# Patient Record
Sex: Female | Born: 1985 | Race: Black or African American | Hispanic: No | Marital: Single | Smoking: Never smoker
Health system: Southern US, Community
[De-identification: ages and names within clinical notes are randomized; demographics above are authoritative.]

## PROBLEM LIST (undated history)

## (undated) DIAGNOSIS — Z8669 Personal history of other diseases of the nervous system and sense organs: Secondary | ICD-10-CM

## (undated) DIAGNOSIS — Z5189 Encounter for other specified aftercare: Secondary | ICD-10-CM

## (undated) DIAGNOSIS — I10 Essential (primary) hypertension: Secondary | ICD-10-CM

## (undated) DIAGNOSIS — D649 Anemia, unspecified: Secondary | ICD-10-CM

## (undated) DIAGNOSIS — N879 Dysplasia of cervix uteri, unspecified: Secondary | ICD-10-CM

## (undated) HISTORY — DX: Dysplasia of cervix uteri, unspecified: N87.9

## (undated) HISTORY — DX: Personal history of other diseases of the nervous system and sense organs: Z86.69

## (undated) HISTORY — DX: Anemia, unspecified: D64.9

## (undated) HISTORY — DX: Essential (primary) hypertension: I10

## (undated) HISTORY — PX: LEEP: SHX91

## (undated) HISTORY — DX: Encounter for other specified aftercare: Z51.89

---
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
## 2007-09-05 HISTORY — PX: CERVICAL ABLATION: SHX5771

## 2011-06-28 DIAGNOSIS — F5102 Adjustment insomnia: Secondary | ICD-10-CM | POA: Insufficient documentation

## 2011-06-28 DIAGNOSIS — G43109 Migraine with aura, not intractable, without status migrainosus: Secondary | ICD-10-CM | POA: Insufficient documentation

## 2011-06-28 DIAGNOSIS — Z309 Encounter for contraceptive management, unspecified: Secondary | ICD-10-CM | POA: Insufficient documentation

## 2011-06-28 DIAGNOSIS — J189 Pneumonia, unspecified organism: Secondary | ICD-10-CM | POA: Insufficient documentation

## 2011-06-28 DIAGNOSIS — B3731 Acute candidiasis of vulva and vagina: Secondary | ICD-10-CM | POA: Insufficient documentation

## 2011-06-28 DIAGNOSIS — R55 Syncope and collapse: Secondary | ICD-10-CM | POA: Insufficient documentation

## 2011-06-28 DIAGNOSIS — L02239 Carbuncle of trunk, unspecified: Secondary | ICD-10-CM | POA: Insufficient documentation

## 2011-06-28 DIAGNOSIS — R87623 High grade squamous intraepithelial lesion on cytologic smear of vagina (HGSIL): Secondary | ICD-10-CM | POA: Insufficient documentation

## 2011-06-28 DIAGNOSIS — M654 Radial styloid tenosynovitis [de Quervain]: Secondary | ICD-10-CM | POA: Insufficient documentation

## 2011-06-28 DIAGNOSIS — Z202 Contact with and (suspected) exposure to infections with a predominantly sexual mode of transmission: Secondary | ICD-10-CM | POA: Insufficient documentation

## 2016-09-20 ENCOUNTER — Ambulatory Visit: Payer: Self-pay | Admitting: Physician Assistant

## 2016-09-25 ENCOUNTER — Ambulatory Visit (INDEPENDENT_AMBULATORY_CARE_PROVIDER_SITE_OTHER): Payer: Self-pay | Admitting: Physician Assistant

## 2016-09-25 ENCOUNTER — Encounter: Payer: Self-pay | Admitting: Physician Assistant

## 2016-09-25 VITALS — BP 131/82 | HR 77 | Ht 63.0 in | Wt 238.0 lb

## 2016-09-25 DIAGNOSIS — Z111 Encounter for screening for respiratory tuberculosis: Secondary | ICD-10-CM

## 2016-09-25 DIAGNOSIS — R7303 Prediabetes: Secondary | ICD-10-CM | POA: Insufficient documentation

## 2016-09-25 DIAGNOSIS — Z23 Encounter for immunization: Secondary | ICD-10-CM

## 2016-09-25 DIAGNOSIS — E669 Obesity, unspecified: Secondary | ICD-10-CM

## 2016-09-25 DIAGNOSIS — IMO0001 Reserved for inherently not codable concepts without codable children: Secondary | ICD-10-CM

## 2016-09-25 DIAGNOSIS — Z6841 Body Mass Index (BMI) 40.0 and over, adult: Secondary | ICD-10-CM

## 2016-09-25 LAB — POCT GLYCOSYLATED HEMOGLOBIN (HGB A1C): Hemoglobin A1C: 6.4

## 2016-09-25 NOTE — Progress Notes (Signed)
HPI:                                                                Lindsay Hall is a 31 y.o. female who presents to [facility]: [place] today to establish care   Patient was previously going to a weight loss clinic in [place] when she had health insurance and states she was doing "shots."She discontinued this because she was "nervous" about the effects of the medication.  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 No other concerns today.   Health Maintenance Health Maintenance  Topic Date Due  . HIV Screening  09/26/2017 (Originally 08/19/1999)  . PAP SMEAR  08/22/2018  . TETANUS/TDAP  09/25/2026  . INFLUENZA VACCINE  Completed    GYN/Sexual Health  Menstrual status: having periods  Menses: regular  Last pap smear: December 2016, normal  History of abnormal pap smears: 2009, cryosurgery  Sexually active: yes   Current contraception: Nexplanon  Declined STI testing  Health Habits  Diet: poor  Exercise: no  ETOH: no  Tobacco: no  Drugs: no  Dental Exam: no  Eye Exam: n/a  Past Medical History:  Diagnosis Date  . Cervical dysplasia   . Hx of migraines    Past Surgical History:  Procedure Laterality Date  . CERVICAL ABLATION  2009   Social History  Substance Use Topics  . Smoking status: Never Smoker  . Smokeless tobacco: Never Used  . Alcohol use No   family history includes Hypertension in her brother, maternal grandmother, and mother; Migraines in her brother, mother, and sister.  Review of Systems  Constitutional: Negative for chills, fever, malaise/fatigue and weight loss.  HENT: Negative.   Eyes: Negative.   Respiratory: Negative.   Cardiovascular: Negative for chest pain, palpitations and leg swelling.  Gastrointestinal: Negative.   Genitourinary: Negative.   Musculoskeletal: Negative.   Skin: Negative.   Neurological: Negative for dizziness, loss of consciousness and  headaches.  Endo/Heme/Allergies: Negative for polydipsia.  Psychiatric/Behavioral: Negative for depression. The patient is not nervous/anxious.     Medications: No current outpatient prescriptions on file.   No current facility-administered medications for this visit.    Allergies  Allergen Reactions  . Shellfish-Derived Products Other (See Comments)    No reaction listed       Objective:  BP 131/82   Pulse 77   Ht 5\' 3"  (1.6 m)   Wt 238 lb (108 kg)   BMI 42.16 kg/m  Gen: well-groomed, obese, cooperative, not ill-appearing, no distress HEENT: normal conjunctiva, TM's clear, oropharynx clear, moist mucus membranes, no thyromegaly or tenderness Lungs: Normal work of breathing, clear to auscultation bilaterally Heart: Normal rate, regular rhythm, s1 and s2 distinct, no murmurs, clicks or rubs appreciated on this exam, no carotid bruit Abd: Soft. Nondistended, Nontender Neuro: alert and oriented x 3, EOM's intact, PERRLA, DTR's intact MSK: strength 5/5 and symmetric, normal gait Extremities: distal pulses intact, no peripheral edema Skin: warm and dry, no rashes or lesions on exposed skin Psych: normal affect, pleasant mood, normal speech and thought content   Results for orders placed or performed in visit on 09/25/16 (from the past 72 hour(s))  POCT HgB A1C     Status: Abnormal  Collection Time: 09/25/16 10:23 AM  Result Value Ref Range   Hemoglobin A1C 6.4    No results found.    Assessment and Plan: 31 y.o. female with    Class 3 obesity without serious comorbidity with body mass index (BMI) of 40.0 to 44.9 in adult, unspecified obesity type ([place]) - patient counseled on physical activity and heart healthy lifestyle - encouraged to return for medical management when she has insurance coverage for some of the weight loss medications that she would be a good candidate for  Prediabetes - POCT HgB A1C 6.4 - patient counseled on diagnosis and prediabetes eating  plan - patient is uninsured and cannot afford nutritionist    Orders Placed This Encounter  Procedures  . Flu Vaccine QUAD 36+ mos IM  . Tdap vaccine greater than or equal to 7yo IM  . TB Skin Test    Placed in the left forearm    Order Specific Question:   Has patient ever tested positive?    Answer:   No  . POCT HgB A1C     Patient education and anticipatory guidance given Patient agrees with treatment plan Follow-up in 1 year for CPE or sooner as needed  Darlyne Russian PA-C

## 2016-09-25 NOTE — Patient Instructions (Addendum)
Prediabetes Eating Plan Prediabetes-also called impaired glucose tolerance or impaired fasting glucose-is a condition that causes blood sugar (blood glucose) levels to be higher than normal. Following a healthy diet can help to keep prediabetes under control. It can also help to lower the risk of type 2 diabetes and heart disease, which are increased in people who have prediabetes. Along with regular exercise, a healthy diet:  Promotes weight loss.  Helps to control blood sugar levels.  Helps to improve the way that the body uses insulin. What do I need to know about this eating plan?  Use the glycemic index (GI) to plan your meals. The index tells you how quickly a food will raise your blood sugar. Choose low-GI foods. These foods take a longer time to raise blood sugar.  Pay close attention to the amount of carbohydrates in the food that you eat. Carbohydrates increase blood sugar levels.  Keep track of how many calories you take in. Eating the right amount of calories will help you to achieve a healthy weight. Losing about 7 percent of your starting weight can help to prevent type 2 diabetes.  You may want to follow a Mediterranean diet. This diet includes a lot of vegetables, lean meats or fish, whole grains, fruits, and healthy oils and fats. What foods can I eat? Grains  Whole grains, such as whole-wheat or whole-grain breads, crackers, cereals, and pasta. Unsweetened oatmeal. Bulgur. Barley. Quinoa. Brown rice. Corn or whole-wheat flour tortillas or taco shells. Vegetables  Lettuce. Spinach. Peas. Beets. Cauliflower. Cabbage. Broccoli. Carrots. Tomatoes. Squash. Eggplant. Herbs. Peppers. Onions. Cucumbers. Brussels sprouts. Fruits  Berries. Bananas. Apples. Oranges. Grapes. Papaya. Mango. Pomegranate. Kiwi. Grapefruit. Cherries. Meats and Other Protein Sources  Seafood. Lean meats, such as chicken and [place] or lean cuts of pork and beef. Tofu. Eggs. Nuts. Beans. Dairy  Low-fat or  fat-free dairy products, such as yogurt, cottage cheese, and cheese. Beverages  Water. Tea. Coffee. Sugar-free or diet soda. Seltzer water. Milk. Milk alternatives, such as soy or almond milk. Condiments  Mustard. Relish. Low-fat, low-sugar ketchup. Low-fat, low-sugar barbecue sauce. Low-fat or fat-free mayonnaise. Sweets and Desserts  Sugar-free or low-fat pudding. Sugar-free or low-fat ice cream and other frozen treats. Fats and Oils  Avocado. Walnuts. Olive oil. The items listed above may not be a complete list of recommended foods or beverages. Contact your dietitian for more options.  What foods are not recommended? Grains  Refined white flour and flour products, such as bread, pasta, snack foods, and cereals. Beverages  Sweetened drinks, such as sweet iced tea and soda. Sweets and Desserts  Baked goods, such as cake, cupcakes, pastries, cookies, and cheesecake. The items listed above may not be a complete list of foods and beverages to avoid. Contact your dietitian for more information.  This information is not intended to replace advice given to you by your health care provider. Make sure you discuss any questions you have with your health care provider. Document Released: 01/05/2015 Document Revised: 01/27/2016 Document Reviewed: 09/16/2014 Elsevier Interactive Patient Education  2017 Elsevier Inc.   Prediabetes Prediabetes is the condition of having a blood sugar (blood glucose) level that is higher than it should be, but not high enough for you to be diagnosed with type 2 diabetes. Having prediabetes puts you at risk for developing type 2 diabetes (type 2 diabetes mellitus). Prediabetes may be called impaired glucose tolerance or impaired fasting glucose. Prediabetes usually does not cause symptoms. Your health care provider can diagnose this condition  with blood tests. You may be tested for prediabetes if you are overweight and if you have at least one other risk factor for  prediabetes. Risk factors for prediabetes include:  Having a family member with type 2 diabetes.  Being overweight or obese.  Being older than age 91.  Being of American-Indian, African-American, Hispanic/Latino, or Asian/Pacific Islander descent.  Having an inactive (sedentary) lifestyle.  Having a history of gestational diabetes or polycystic ovarian syndrome (PCOS).  Having low levels of good cholesterol (HDL-C) or high levels of blood fats (triglycerides).  Having high blood pressure. What is blood glucose and how is blood glucose measured?   Blood glucose refers to the amount of glucose in your bloodstream. Glucose comes from eating foods that contain sugars and starches (carbohydrates) that the body breaks down into glucose. Your blood glucose level may be measured in mg/dL (milligrams per deciliter) or mmol/L (millimoles per liter).Your blood glucose may be checked with one or more of the following blood tests:  A fasting blood glucose (FBG) test. You will not be allowed to eat (you will fast) for at least 8 hours before a blood sample is taken.  A normal range for FBG is 70-100 mg/dl (3.9-5.6 mmol/L).  An A1c (hemoglobin A1c) blood test. This test provides information about blood glucose control over the previous 2?4months.  An oral glucose tolerance test (OGTT). This test measures your blood glucose twice:  After fasting. This is your baseline level.  Two hours after you drink a beverage that contains glucose. You may be diagnosed with prediabetes:  If your FBG is 100?125 mg/dL (5.6-6.9 mmol/L).  If your A1c level is 5.7?6.4%.  If your OGGT result is 140?199 mg/dL (7.8-11 mmol/L). These blood tests may be repeated to confirm your diagnosis. What happens if blood glucose is too high? The pancreas produces a hormone (insulin) that helps move glucose from the bloodstream into cells. When cells in the body do not respond properly to insulin that the body makes  (insulin resistance), excess glucose builds up in the blood instead of going into cells. As a result, high blood glucose (hyperglycemia) can develop, which can cause many complications. This is a symptom of prediabetes. What can happen if blood glucose stays higher than normal for a long time? Having high blood glucose for a long time is dangerous. Too much glucose in your blood can damage your nerves and blood vessels. Long-term damage can lead to complications from diabetes, which may include:  Heart disease.  Stroke.  Blindness.  Kidney disease.  Depression.  Poor circulation in the feet and legs, which could lead to surgical removal (amputation) in severe cases. How can prediabetes be prevented from turning into type 2 diabetes?   To help prevent type 2 diabetes, take the following actions:  Be physically active.  Do moderate-intensity physical activity for at least 30 minutes on at least 5 days of the week, or as much as told by your health care provider. This could be brisk walking, biking, or water aerobics.  Ask your health care provider what activities are safe for you. A mix of physical activities may be best, such as walking, swimming, cycling, and strength training.  Lose weight as told by your health care provider.  Losing 5-7% of your body weight can reverse insulin resistance.  Your health care provider can determine how much weight loss is best for you and can help you lose weight safely.  Follow a healthy meal plan. This includes eating lean  proteins, complex carbohydrates, fresh fruits and vegetables, low-fat dairy products, and healthy fats.  Follow instructions from your health care provider about eating or drinking restrictions.  Make an appointment to see a diet and nutrition specialist (registered dietitian) to help you create a healthy eating plan that is right for you.  Do not smoke or use any tobacco products, such as cigarettes, chewing tobacco, and  e-cigarettes. If you need help quitting, ask your health care provider.  Take over-the-counter and prescription medicines as told by your health care provider. You may be prescribed medicines that help lower the risk of type 2 diabetes. This information is not intended to replace advice given to you by your health care provider. Make sure you discuss any questions you have with your health care provider. Document Released: 12/13/2015 Document Revised: 01/27/2016 Document Reviewed: 10/12/2015 Elsevier Interactive Patient Education  2017 Elsevier Inc.   Physical Activity Recommendations  1. Aerobic exercise  Frequency: 3-5 sessions per week  Intensity: 50-80% capacity  Duration: 20 - 60 minutes  Examples: walking, treadmill, cycling, rowing, stair climbing, and arm/leg ergometry  2. Resistance exercise  Frequency: 2-3 sessions per week  Intensity: 10-15 repetitions/set to moderate fatigue  Duration: 1-3 sets of 8-10 upper and lower body exercises  Examples: calisthenics, elastic bands, cuff/hand weights, dumbbels, free weights, wall pulleys, and weight machines  Heart-Healthy Lifestyle  Eating a diet rich in vegetables, fruits and whole grains: also includes low-fat dairy products, poultry, fish, legumes, and nuts; limit intake of sweets, sugar-sweetened beverages and red meats  Getting regular exercise  Maintaining a healthy weight  Not smoking or getting help quitting  Staying on top of your health; for some people, lifestyle changes alone may not be enough to prevent a heart attack or stroke. In these cases, taking a statin at the right dose will most likely be necessary

## 2016-09-27 ENCOUNTER — Ambulatory Visit (INDEPENDENT_AMBULATORY_CARE_PROVIDER_SITE_OTHER): Payer: Self-pay | Admitting: Physician Assistant

## 2016-09-27 VITALS — BP 138/79 | HR 66 | Wt 237.0 lb

## 2016-09-27 DIAGNOSIS — Z111 Encounter for screening for respiratory tuberculosis: Secondary | ICD-10-CM

## 2016-09-27 NOTE — Progress Notes (Signed)
   Subjective:    Patient ID: Lindsay Hall, female    DOB: [birth year], 31 y.o.   MRN: [id number]  Sarh is here for a PPD read.       Review of Systems  Constitutional: Negative.   HENT: Negative.   Eyes: Negative.   Respiratory: Negative.   Cardiovascular: Negative.   Gastrointestinal: Negative.   Genitourinary: Negative.        Objective:   Physical Exam        Assessment & Plan:  Screening for TB - PPD negative, paperwork signed.

## 2016-10-02 ENCOUNTER — Ambulatory Visit (INDEPENDENT_AMBULATORY_CARE_PROVIDER_SITE_OTHER): Payer: Self-pay | Admitting: Physician Assistant

## 2016-10-02 VITALS — BP 123/66 | HR 58 | Temp 98.3°F | Ht 63.75 in | Wt 236.1 lb

## 2016-10-02 DIAGNOSIS — Z111 Encounter for screening for respiratory tuberculosis: Secondary | ICD-10-CM

## 2016-10-02 DIAGNOSIS — Z23 Encounter for immunization: Secondary | ICD-10-CM

## 2016-10-02 NOTE — Progress Notes (Signed)
   Subjective:    Patient ID: Lindsay Hall, female    DOB: Jan 18, 1986, 31 y.o.   MRN: [id number]  HPI Pt is here for a PPD Placement and Varicella vaccine. Pt has documentation of receiving the first varicella vaccine. 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  Review of Systems  Constitutional: Negative for fever.  Respiratory: Negative for shortness of breath.   Cardiovascular: Negative for chest pain.       Objective:   Physical Exam        Assessment & Plan:  Patient tolerated both injections well without complications. PPD was placed in left arm.

## 2016-10-04 ENCOUNTER — Ambulatory Visit (INDEPENDENT_AMBULATORY_CARE_PROVIDER_SITE_OTHER): Payer: Self-pay | Admitting: Physician Assistant

## 2016-10-04 VITALS — BP 119/76 | HR 66 | Ht 64.0 in | Wt 236.9 lb

## 2016-10-04 DIAGNOSIS — Z111 Encounter for screening for respiratory tuberculosis: Secondary | ICD-10-CM

## 2016-10-04 NOTE — Progress Notes (Signed)
   Subjective:    Patient ID: Lindsay Hall, female    DOB: 05-02-1986, 31 y.o.   MRN: [id number]  HPI Pt is here for PPD read.   Review of Systems     Objective:   Physical Exam        Assessment & Plan:  Results were negative 0.mm.

## 2018-04-03 ENCOUNTER — Ambulatory Visit (INDEPENDENT_AMBULATORY_CARE_PROVIDER_SITE_OTHER): Payer: Self-pay | Admitting: Physician Assistant

## 2018-04-03 ENCOUNTER — Encounter: Payer: Self-pay | Admitting: Physician Assistant

## 2018-04-03 VITALS — BP 118/77 | HR 65 | Temp 98.6°F | Wt 239.0 lb

## 2018-04-03 DIAGNOSIS — R197 Diarrhea, unspecified: Secondary | ICD-10-CM

## 2018-04-03 DIAGNOSIS — R112 Nausea with vomiting, unspecified: Secondary | ICD-10-CM

## 2018-04-03 MED ORDER — METRONIDAZOLE 250 MG PO TABS: 250.0000 mg | ORAL_TABLET | Freq: Three times a day (TID) | ORAL | 0 refills | Status: AC

## 2018-04-03 MED ORDER — ONDANSETRON 4 MG PO TBDP: 4.0000 mg | ORAL_TABLET | Freq: Three times a day (TID) | ORAL | 0 refills | Status: DC | PRN

## 2018-04-03 NOTE — Progress Notes (Signed)
HPI:                                                                Lindsay Hall is a 32 y.o. female who presents to [facility]: [place] today for vomiting and diarrhea  Patient reports recurrent diarrhea, nausea, vomiting and "sulfa burps". Problem began in 2017. Symptoms were waxing and waning over a 6 month period. Reports she was treated for BV with Flagyl and her GI symptoms went away.  Most recent episode began approximately 6 weeks ago and again are waxing and waning. Reports vomiting has been so forceful she has fecal incontinence. Emesis contains stomach contents. Denies any international travel or suspect food intake. Denies fever, chills, malaise, abdominal pain, hematemesis, hematochezia/melena.  No flowsheet data found.  No flowsheet data found.    Past Medical History:  Diagnosis Date  . Cervical dysplasia   . Hx of migraines    Past Surgical History:  Procedure Laterality Date  . CERVICAL ABLATION  2009   Social History   Tobacco Use  . Smoking status: Never Smoker  . Smokeless tobacco: Never Used  Substance Use Topics  . Alcohol use: No   family history includes Hypertension in her brother, maternal grandmother, and mother; Migraines in her brother, mother, and sister.    ROS: negative except as noted in the HPI  Medications: No current outpatient medications on file.   No current facility-administered medications for this visit.    Allergies  Allergen Reactions  . Shellfish-Derived Products Other (See Comments)    No reaction listed       Objective:  BP 118/77   Pulse 65   Temp 98.6 F (37 C) (Oral)   Wt 239 lb (108.4 kg)   BMI 41.02 kg/m  Gen:  alert, not ill-appearing, no distress, appropriate for age, obese female HEENT: head normocephalic without obvious abnormality, conjunctiva and cornea clear, trachea midline Pulm: Normal work of breathing, normal phonation, clear to auscultation  bilaterally, no wheezes, rales or rhonchi CV: Normal rate, regular rhythm, s1 and s2 distinct, no murmurs, clicks or rubs  GI: bowel sounds active, abdomen soft, non-tender, non-distended, no palpable masses Neuro: alert and oriented x 3, no tremor MSK: extremities atraumatic, normal gait and station Skin: intact, no rashes on exposed skin, no jaundice, no cyanosis   No results found for this or any previous visit (from the past 72 hour(s)). No results found.    Assessment and Plan: 32 y.o. female with   Diarrhea of presumed infectious origin - Plan: Ova and parasite examination, Giardia Antigen (Stool), Stool Culture, metroNIDAZOLE (FLAGYL) 250 MG tablet  Non-intractable vomiting with nausea, unspecified vomiting type - Plan: ondansetron (ZOFRAN-ODT) 4 MG disintegrating tablet  Afebrile, no tachycardia, benign abdominal exam Symptoms are concerning for Giardia or intestinal parasite. Stool studies pending. Will cover her for Giardia with Flagyl. Instructed to start Flagyl after submitting stool samples to try to prevent a false negative. Supportive care with anti-emetic and bland diet.  Patient education and anticipatory guidance given Patient agrees with treatment plan Follow-up as needed if symptoms worsen or fail to improve  Darlyne Russian PA-C

## 2018-04-03 NOTE — Patient Instructions (Signed)
Food Choices to Help Relieve Diarrhea, Adult  When you have diarrhea, the foods you eat and your eating habits are very important. Choosing the right foods and drinks can help:  · Relieve diarrhea.  · Replace lost fluids and nutrients.  · Prevent dehydration.    What general guidelines should I follow?  Relieving diarrhea  · Choose foods with less than 2 g or .07 oz. of fiber per serving.  · Limit fats to less than 8 tsp (38 g or 1.34 oz.) a day.  · Avoid the following:  ? Foods and beverages sweetened with high-fructose corn syrup, honey, or sugar alcohols such as xylitol, sorbitol, and mannitol.  ? Foods that contain a lot of fat or sugar.  ? Fried, greasy, or spicy foods.  ? High-fiber grains, breads, and cereals.  ? Raw fruits and vegetables.  · Eat foods that are rich in probiotics. These foods include dairy products such as yogurt and fermented milk products. They help increase healthy bacteria in the stomach and intestines (gastrointestinal tract, or GI tract).  · If you have lactose intolerance, avoid dairy products. These may make your diarrhea worse.  · Take medicine to help stop diarrhea (antidiarrheal medicine) only as told by your health care provider.  Replacing nutrients  · Eat small meals or snacks every 3–4 hours.  · Eat bland foods, such as white rice, toast, or baked potato, until your diarrhea starts to get better. Gradually reintroduce nutrient-rich foods as tolerated or as told by your health care provider. This includes:  ? Well-cooked protein foods.  ? Peeled, seeded, and soft-cooked fruits and vegetables.  ? Low-fat dairy products.  · Take vitamin and mineral supplements as told by your health care provider.  Preventing dehydration    · Start by sipping water or a special solution to prevent dehydration (oral rehydration solution, ORS). Urine that is clear or pale yellow means that you are getting enough fluid.  · Try to drink at least 8–10 cups of fluid each day to help replace lost  fluids.  · You may add other liquids in addition to water, such as clear juice or decaffeinated sports drinks, as tolerated or as told by your health care provider.  · Avoid drinks with caffeine, such as coffee, tea, or soft drinks.  · Avoid alcohol.  What foods are recommended?  The items listed may not be a complete list. Talk with your health care provider about what dietary choices are best for you.  Grains  White rice. White, French, or pita breads (fresh or toasted), including plain rolls, buns, or bagels. White pasta. Saltine, soda, or graham crackers. Pretzels. Low-fiber cereal. Cooked cereals made with water (such as cornmeal, farina, or cream cereals). Plain muffins. Matzo. Melba toast. Zwieback.  Vegetables  Potatoes (without the skin). Most well-cooked and canned vegetables without skins or seeds. Tender lettuce.  Fruits  Apple sauce. Fruits canned in juice. Cooked apricots, cherries, grapefruit, peaches, pears, or plums. Fresh bananas and cantaloupe.  Meats and other protein foods  Baked or boiled chicken. Eggs. Tofu. Fish. Seafood. Smooth nut butters. Ground or well-cooked tender beef, ham, veal, lamb, pork, or poultry.  Dairy  Plain yogurt, kefir, and unsweetened liquid yogurt. Lactose-free milk, buttermilk, skim milk, or soy milk. Low-fat or nonfat hard cheese.  Beverages  Water. Low-calorie sports drinks. Fruit juices without pulp. Strained tomato and vegetable juices. Decaffeinated teas. Sugar-free beverages not sweetened with sugar alcohols. Oral rehydration solutions, if approved by your health care   provider.  Seasoning and other foods  Bouillon, broth, or soups made from recommended foods.  What foods are not recommended?  The items listed may not be a complete list. Talk with your health care provider about what dietary choices are best for you.  Grains  Whole grain, whole wheat, bran, or rye breads, rolls, pastas, and crackers. Wild or brown rice. Whole grain or bran cereals. Barley. Oats  and oatmeal. Corn tortillas or taco shells. Granola. Popcorn.  Vegetables  Raw vegetables. Fried vegetables. Cabbage, broccoli, Brussels sprouts, artichokes, baked beans, beet greens, corn, kale, legumes, peas, sweet potatoes, and yams. Potato skins. Cooked spinach and cabbage.  Fruits  Dried fruit, including raisins and dates. Raw fruits. Stewed or dried prunes. Canned fruits with syrup.  Meat and other protein foods  Fried or fatty meats. Deli meats. Chunky nut butters. Nuts and seeds. Beans and lentils. Bacon. Hot dogs. Sausage.  Dairy  High-fat cheeses. Whole milk, chocolate milk, and beverages made with milk, such as milk shakes. Half-and-half. Cream. sour cream. Ice cream.  Beverages  Caffeinated beverages (such as coffee, tea, soda, or energy drinks). Alcoholic beverages. Fruit juices with pulp. Prune juice. Soft drinks sweetened with high-fructose corn syrup or sugar alcohols. High-calorie sports drinks.  Fats and oils  Butter. Cream sauces. Margarine. Salad oils. Plain salad dressings. Olives. Avocados. Mayonnaise.  Sweets and desserts  Sweet rolls, doughnuts, and sweet breads. Sugar-free desserts sweetened with sugar alcohols such as xylitol and sorbitol.  Seasoning and other foods  Honey. Hot sauce. Chili powder. Gravy. Cream-based or milk-based soups. Pancakes and waffles.  Summary  · When you have diarrhea, the foods you eat and your eating habits are very important.  · Make sure you get at least 8–10 cups of fluid each day, or enough to keep your urine clear or pale yellow.  · Eat bland foods and gradually reintroduce healthy, nutrient-rich foods as tolerated, or as told by your health care provider.  · Avoid high-fiber, fried, greasy, or spicy foods.  This information is not intended to replace advice given to you by your health care provider. Make sure you discuss any questions you have with your health care provider.  Document Released: 11/11/2003 Document Revised: 08/18/2016 Document  Reviewed: 08/18/2016  Elsevier Interactive Patient Education © 2018 [employer]

## 2018-04-11 LAB — STOOL CULTURE
MICRO NUMBER: 90917026
MICRO NUMBER:: 90917025
MICRO NUMBER:: 90917029
SHIGA RESULT:: NOT DETECTED
SPECIMEN QUALITY:: ADEQUATE
SPECIMEN QUALITY:: ADEQUATE
SPECIMEN QUALITY:: ADEQUATE

## 2018-04-11 LAB — GIARDIA ANTIGEN
MICRO NUMBER: 90917027
RESULT:: NOT DETECTED
SPECIMEN QUALITY:: ADEQUATE

## 2018-04-11 LAB — OVA AND PARASITE EXAMINATION
CONCENTRATE RESULT:: NONE SEEN
MICRO NUMBER:: 90917028
SPECIMEN QUALITY:: ADEQUATE
TRICHROME RESULT: NONE SEEN

## 2018-04-16 ENCOUNTER — Encounter: Payer: Self-pay | Admitting: Physician Assistant

## 2018-08-12 ENCOUNTER — Ambulatory Visit: Payer: Self-pay | Admitting: Osteopathic Medicine

## 2018-08-13 ENCOUNTER — Ambulatory Visit (INDEPENDENT_AMBULATORY_CARE_PROVIDER_SITE_OTHER): Payer: Self-pay | Admitting: Osteopathic Medicine

## 2018-08-13 ENCOUNTER — Encounter: Payer: Self-pay | Admitting: Osteopathic Medicine

## 2018-08-13 VITALS — BP 145/90 | HR 59 | Temp 98.0°F | Wt 241.3 lb

## 2018-08-13 DIAGNOSIS — Z3046 Encounter for surveillance of implantable subdermal contraceptive: Secondary | ICD-10-CM

## 2018-08-13 DIAGNOSIS — Z01818 Encounter for other preprocedural examination: Secondary | ICD-10-CM

## 2018-08-13 NOTE — Progress Notes (Signed)
Nexplanon Removal Procedure Note PRE-OP DIAGNOSIS: Nexplanon, desire for change of contraception - wants to pursue BTL POST-OP DIAGNOSIS: Same  PROCEDURE: Nexplanon Removal  Performing Physician: Sheppard Coil PROCEDURE:  Anesthesia: 2% Lidocaine w/ epinephrine 5 ml  Procedure: Consent obtained. A time-out was performed prior to initiating procedure to be sure of right patient and right location. The area surrounding the Nexplanon was prepared in the usual sterile manner. The site was anesthetized with lidocaine. A skin incision was made over the distal aspect of the device. The capsule lysed sharply and the device removed using a hemostat. Hemostasis was assured. The site was dressed with SteriStrips. The patient tolerated the procedure well.  Followup: The patient tolerated the procedure well without complications. Standard post-procedure care is explained and return precautions are given.     The primary encounter diagnosis was Encounter for Nexplanon removal. A diagnosis of Tubal ligation evaluation was also pertinent to this visit.   Orders Placed This Encounter  Procedures  . Ambulatory referral to Obstetrics / Gynecology   Pt currently abstinent.      Return for recheck as needed, otherwise routine check-up as directed w/ Evlyn Clines .

## 2018-08-13 NOTE — Patient Instructions (Addendum)
If redness, pain, or discharge from the wound, see me right away! Steri strips will stay in place on their own.

## 2018-09-16 ENCOUNTER — Ambulatory Visit (INDEPENDENT_AMBULATORY_CARE_PROVIDER_SITE_OTHER): Payer: Self-pay | Admitting: Physician Assistant

## 2018-09-16 ENCOUNTER — Encounter: Payer: Self-pay | Admitting: Physician Assistant

## 2018-09-16 VITALS — BP 138/83 | HR 62 | Temp 98.3°F | Wt 238.0 lb

## 2018-09-16 DIAGNOSIS — H5712 Ocular pain, left eye: Secondary | ICD-10-CM

## 2018-09-16 MED ORDER — POLYMYXIN B-TRIMETHOPRIM 10000-0.1 UNIT/ML-% OP SOLN: 1.0000 [drp] | Freq: Four times a day (QID) | OPHTHALMIC | 0 refills | Status: AC

## 2018-09-16 MED ORDER — ERYTHROMYCIN 5 MG/GM OP OINT: 1.0000 "application " | TOPICAL_OINTMENT | Freq: Every day | OPHTHALMIC | 0 refills | Status: AC

## 2018-09-16 MED ORDER — TETRACAINE HCL 0.5 % OP SOLN
2.0000 [drp] | Freq: Once | OPHTHALMIC | Status: AC
  Administered 2018-09-16: 2 [drp] via OPHTHALMIC

## 2018-09-16 NOTE — Progress Notes (Signed)
HPI:                                                                Lindsay Hall is a 33 y.o. female who presents to [facility] today for eye redness  Developed throbbing left lateral eye pain, sudden onset 4 days ago. Patient reports she woke up with the pain. States there was initially a foreign body sensation, but this has since resolved. Pain is constant, described as throbbing, rated as mild, gradually improving. 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 Denies URI symptoms. She does not wear contact lenses.    Past Medical History:  Diagnosis Date  . Cervical dysplasia   . Hx of migraines    Past Surgical History:  Procedure Laterality Date  . CERVICAL ABLATION  2009   Social History   Tobacco Use  . Smoking status: Never Smoker  . Smokeless tobacco: Never Used  Substance Use Topics  . Alcohol use: No   family history includes Hypertension in her brother, maternal grandmother, and mother; Migraines in her brother, mother, and sister.    ROS: negative except as noted in the HPI  Medications: Current Outpatient Medications  Medication Sig Dispense Refill  . etonogestrel (NEXPLANON) 68 MG IMPL implant 1 each by Subdermal route once.    Marland Kitchen erythromycin ophthalmic ointment Place 1 application into the left eye at bedtime for 5 days. 5 g 0  . trimethoprim-polymyxin b (POLYTRIM) ophthalmic solution Place 1 drop into the left eye 4 (four) times daily for 5 days. 10 mL 0   No current facility-administered medications for this visit.    Allergies  Allergen Reactions  . Shellfish-Derived Products Other (See Comments)    No reaction listed       Objective:  BP 138/83   Pulse 62   Temp 98.3 F (36.8 C) (Oral)   Wt 238 lb (108 kg)   BMI 40.85 kg/m  Physical Exam  Eyes: Pupils are equal, round, and reactive to light. EOM are normal. Lids are everted and swept, no  foreign bodies found. Right eye exhibits no chemosis, no discharge and no exudate. No foreign body present in the right eye. Left eye exhibits no chemosis, no discharge and no exudate. No foreign body present in the left eye. Left conjunctiva is injected. Left conjunctiva has no hemorrhage. Left eye exhibits normal extraocular motion (pain with left lateral upward gaze ).  Slit lamp exam:      The left eye shows no corneal abrasion, no foreign body and no fluorescein uptake.    Visual acuity OD 20/20, OS 20/25, OU 20/25 +photophobia     No results found for this or any previous visit (from the past 67 hour(s)). No results found.    Assessment and Plan: 33 y.o. female with   .Cherysh was seen today for conjunctivitis.  Diagnoses and all orders for this visit:  Acute left eye pain -     trimethoprim-polymyxin b (POLYTRIM) ophthalmic solution; Place 1 drop into the left eye 4 (four) times daily for 5 days. -     erythromycin ophthalmic ointment; Place 1 application into the left eye at bedtime for 5 days. -  Ambulatory referral to Ophthalmology    Reassuring features - symptoms are gradually improving, pain is rated as mild, pain improves with topical anaesthetic, visual acuity intact Woods lamp exam did not show evidence of corneal abrasion Differential also includes uveitis  Will start topical antibiotics for possible tiny corneal abrasion and refer to Ophth  Patient education and anticipatory guidance given Patient agrees with treatment plan Follow-up as needed if symptoms worsen or fail to improve  Darlyne Russian PA-C

## 2018-09-16 NOTE — Patient Instructions (Signed)
Corneal Abrasion    A corneal abrasion is a scratch or injury to the clear covering over the front of your eye (cornea). Your cornea forms a clear dome that protects your eye and helps to focus your vision. Your cornea is made up of many layers. The surface layer is a single layer of cells (corneal epithelium). It is one of the most sensitive tissues in your body. A corneal abrasion can be very painful.  If a corneal abrasion is not treated, it can become infected and cause an ulcer. This can lead to scarring. A scarred cornea can affect your vision. Sometimes abrasions come back in the same area, even after the original injury has healed (recurrent erosion syndrome).  What are the causes?  This condition may be caused by:  · A poke in the eye.  · A gritty or irritating substance (foreign body) in the eye.  · Excessive eye rubbing.  · Very dry eyes.  · Certain eye infections.  · Contact lenses that fit poorly or are worn for a long period of time. You can also injure your cornea when putting contacts lenses in your eye or taking them out.  · Eye surgery.  Sometimes, the cause is unknown.  What are the signs or symptoms?  Symptoms of this condition include:  · Eye pain. The pain may get worse when your eye is open or when you move your eye.  · A feeling of something stuck in your eye.  · Having trouble keeping your eye open, or not being able to keep it open.  · Tearing and redness.  · Sensitivity to light.  · Blurred vision.  · Headache.  How is this diagnosed?  This condition may be diagnosed based on:  · Your medical history.  · Your symptoms.  · An eye exam. You may work with a health care provider who specializes in diseases and conditions of the eye (ophthalmologist). Before the eye exam, numbing drops may be put into your eye. You may also have dye put in your eye with a dropper or a small paper strip. The dye makes the abrasion easy to see when your ophthalmologist examines your eye with a light. Your  ophthalmologist may look at your eye through an eye scope (slit lamp).  How is this treated?  Treatment may vary depending on the cause of your condition, and it may include:  · Washing out your eye.  · Removing any foreign body.  · Antibiotic drops or ointment to treat an infection.  · Steroid drops or ointment to treat redness, irritation, or inflammation.  · Pain medicine.  · An eye patch to keep your eye closed.  Follow these instructions at home:  Medicines  · Use eye drops or ointments as told by your eye care provider.  · If you were prescribed antibiotic drops or ointment, use them as told by your eye care provider. Do not stop using the antibiotic even if you start to feel better.  · Take over-the-counter and prescription medicines only as told by your eye care provider.  · Do not drive or use heavy machinery while taking prescription pain medicine.  General instructions  · If you have an eye patch, wear it as told by your eye care provider.  ? Do not drive or use machinery while wearing an eye patch. Your ability to judge distances will be impaired.  ? Follow instructions from your eye care provider about when to remove the patch.  ·   Ask your eye care provider whether you can use a cold, wet cloth (compress) on your eye to relieve pain.  · Do not rub or touch your eye. Do not wash out your eye.  · Do not wear contact lenses until your eye care provider says that this is okay.  · Avoid bright light and eye strain.  · Keep all follow-up visits as told by your eye care provider. This is important for preventing infection and vision loss.  Contact a health care provider if:  · You continue to have eye pain and other symptoms for more than 2 days.  · You develop new symptoms, such as redness, tearing, or discharge.  · You have discharge that makes your eyelids stick together in the morning.  · Your eye patch becomes so loose that you can blink your eye.  · Symptoms return after the original abrasion has  healed.  Get help right away if:  · You have severe eye pain that does not get better with medicine.  · You have vision loss.  Summary  · A corneal abrasion is a scratch on the outer layer of the clear covering over the front of your eye (cornea).  · Corneal abrasion can cause eye pain, redness, tearing, and blurred vision.  · This condition is usually treated with medicine to prevent infection and scarring. You also may have to wear an eye patch to cover your eye.  · Let your eye care provider know if your symptoms continue for more than 2 days.  This information is not intended to replace advice given to you by your health care provider. Make sure you discuss any questions you have with your health care provider.  Document Released: 08/18/2000 Document Revised: 08/01/2016 Document Reviewed: 08/01/2016  Elsevier Interactive Patient Education © 2019 [employer]

## 2018-09-20 ENCOUNTER — Encounter: Payer: Self-pay | Admitting: Physician Assistant

## 2020-05-26 ENCOUNTER — Encounter: Payer: Self-pay | Admitting: Medical-Surgical

## 2020-05-26 ENCOUNTER — Ambulatory Visit (INDEPENDENT_AMBULATORY_CARE_PROVIDER_SITE_OTHER): Payer: BC Managed Care – PPO | Admitting: Medical-Surgical

## 2020-05-26 ENCOUNTER — Other Ambulatory Visit: Payer: Self-pay

## 2020-05-26 VITALS — BP 131/85 | HR 76 | Temp 98.1°F | Ht 64.0 in | Wt 240.1 lb

## 2020-05-26 DIAGNOSIS — R002 Palpitations: Secondary | ICD-10-CM | POA: Diagnosis not present

## 2020-05-26 DIAGNOSIS — Z114 Encounter for screening for human immunodeficiency virus [HIV]: Secondary | ICD-10-CM | POA: Diagnosis not present

## 2020-05-26 DIAGNOSIS — L659 Nonscarring hair loss, unspecified: Secondary | ICD-10-CM

## 2020-05-26 DIAGNOSIS — Z1159 Encounter for screening for other viral diseases: Secondary | ICD-10-CM

## 2020-05-26 DIAGNOSIS — R7303 Prediabetes: Secondary | ICD-10-CM

## 2020-05-26 DIAGNOSIS — Z0000 Encounter for general adult medical examination without abnormal findings: Secondary | ICD-10-CM

## 2020-05-26 DIAGNOSIS — Z308 Encounter for other contraceptive management: Secondary | ICD-10-CM

## 2020-05-26 DIAGNOSIS — R0683 Snoring: Secondary | ICD-10-CM

## 2020-05-26 DIAGNOSIS — L739 Follicular disorder, unspecified: Secondary | ICD-10-CM

## 2020-05-26 NOTE — Progress Notes (Signed)
HPI: Lindsay Hall is a 34 y.o. female who  has a past medical history of Cervical dysplasia and migraines.  she presents to [facility] today, 05/26/20,  for chief complaint of:  Annual physical exam  Dentist: upcoming appointment for 2 teeth pulled Eye exam: none recently, wears glasses, not updated in years Exercise: none currently due to having 2 jobs Diet: tries to eat healthy as much as possible, cut out cows milk and substituted almond milk  Concerns: Daily headaches- wakes with the almost every morning; history of migraines  Snores loudly, daytime sleepiness and fatigue, witnessed apneic episodes  Palpitations- daily, several times a day, lasts for about 5 minutes, heart pounding, no chest pain  Bilateral lower extremity edema- lasts for hours, improves with elevation, wears compression stockings  Shortness of breath-intermittent, daily, with or without palpitation, not related to rest or activity, no dizziness/lightheadedness or nausea, + diaphoresis with her episode over the weekend.  Resolve spontaneously.  Past medical, surgical, social and family history reviewed:  Patient Active Problem List   Diagnosis Date Noted  . Class 3 obesity without serious comorbidity with body mass index (BMI) of 40.0 to 44.9 in adult 09/25/2016  . Prediabetes 09/25/2016    Past Surgical History:  Procedure Laterality Date  . CERVICAL ABLATION  2009    Social History   Tobacco Use  . Smoking status: Never Smoker  . Smokeless tobacco: Never Used  Substance Use Topics  . Alcohol use: No    Family History  Problem Relation Age of Onset  . Hypertension Mother   . Migraines Mother   . Migraines Sister   . Hypertension Brother   . Migraines Brother   . Hypertension Maternal Grandmother      Current medication list and allergy/intolerance information reviewed:    Current Outpatient Medications  Medication Sig Dispense Refill  .  aspirin-acetaminophen-caffeine (EXCEDRIN MIGRAINE) 250-250-65 MG tablet Take by mouth every 6 (six) hours as needed for headache.     No current facility-administered medications for this visit.    Allergies  Allergen Reactions  . Shellfish-Derived Products Other (See Comments)    No reaction listed      Review of Systems:  Constitutional:  No  fever, no chills, No recent illness, No unintentional weight changes. + significant fatigue.   HEENT: +  headache, no vision change, no hearing change, No sore throat, No  sinus pressure  Cardiac: No  chest pain, No  pressure, + palpitations, No  Orthopnea, + bilateral lower extremity edema  Respiratory:  + shortness of breath. No  Cough  Gastrointestinal: No  abdominal pain, No  nausea, No  vomiting,  No  blood in stool, No  diarrhea, No  constipation   Musculoskeletal: No new myalgia/arthralgia  Skin: No  Rash, No other wounds/concerning lesions, small pedunculated dark brown skin tag to the posterior right neck  Genitourinary: No  incontinence, No  abnormal genital bleeding, No abnormal genital discharge  Hem/Onc: No  easy bruising/bleeding, No  abnormal lymph node  Endocrine: No cold intolerance,  No heat intolerance. No polyuria/polydipsia/polyphagia   Neurologic: No  weakness, No  dizziness, No  slurred speech/focal weakness/facial droop  Psychiatric: No  concerns with depression, No  concerns with anxiety, No sleep problems, No mood problems, + increased stress  Exam:  BP 131/85   Pulse 76   Temp 98.1 F (36.7 C) (Oral)   Ht 5' 4"  (1.626 m)   Wt 240 lb 1.6 oz (108.9  kg)   LMP 05/17/2020 (Exact Date)   SpO2 99%   BMI 41.21 kg/m   Constitutional: VS see above. General Appearance: alert, well-developed, well-nourished, NAD  Eyes: Normal lids and conjunctive, non-icteric sclera  Ears, Nose, Mouth, Throat: MMM, Normal external inspection ears/nares/mouth/lips/gums. TM normal bilaterally. Pharynx/tonsils no erythema, no  exudate. Nasal mucosa normal.   Neck: No masses, trachea midline. No thyroid enlargement. No tenderness/mass appreciated. No lymphadenopathy  Respiratory: Normal respiratory effort. no wheeze, no rhonchi, no rales  Cardiovascular: S1/S2 normal, no murmur, no rub/gallop auscultated. RRR. + lower extremity edema. Pedal pulse II/IV bilaterally DP and PT. No carotid bruit or JVD. No abdominal aortic bruit.  Gastrointestinal: Nontender, no masses. No hepatomegaly, no splenomegaly. No hernia appreciated. Bowel sounds normal. Rectal exam deferred.   Musculoskeletal: Gait normal. No clubbing/cyanosis of digits.   Neurological: Normal balance/coordination. No tremor. No cranial nerve deficit on limited exam. Motor and sensation intact and symmetric. Cerebellar reflexes intact.   Skin: warm, dry, intact. No rash/ulcer. No concerning nevi or subq nodules on limited exam.    Psychiatric: Normal judgment/insight. Normal mood and affect. Oriented x3.    No results found for this or any previous visit (from the past 72 hour(s)).  No results found.   ASSESSMENT/PLAN:   1. Annual physical exam Checking CBC, CMP, and lipid panel today. - CBC - COMPLETE METABOLIC PANEL WITH GFR - Lipid panel  2. Prediabetes Checking hemoglobin A1c today. - HgB A1c  3. Encounter for screening for HIV Discussed screening recommendations.  Patient agreeable so adding to blood work today. - HIV Antibody (routine testing w rflx)  4. Need for hepatitis C screening test Discussed screening recommendations.  Patient agreeable so adding this to blood work today as well. - Hepatitis C Antibody  5. Palpitations In office EKG completed-normal sinus rhythm with sinus arrhythmia, rate of 61 with normal axis.  Checking TSH.  She has had no previous cardiac work-up so we will go ahead and get an echocardiogram.  Also plan to do a Zio patch for long-term monitoring to see if we can catch evidence of her palpitations. - EKG  12-Lead - ECHOCARDIOGRAM COMPLETE; Future - LONG TERM MONITOR (3-14 DAYS); Future - TSH  6. Loud snoring Stop bang risk score of sleep apnea was 6 showing high risk.  She has never had a sleep study so we will go ahead and order a home sleep study.  This could definitely be the cause of her daytime sleepiness, fatigue, and morning headaches. - Home sleep test  7. Encounter for other contraceptive management Previously had a Nexplanon for birth control but has had this removed.  Not currently using any birth control measures and does not desire future pregnancies.  Would like to discuss contraception options with an OB/GYN, specifically surgical options.   - Ambulatory referral to Obstetrics / Gynecology  Orders Placed This Encounter  Procedures  . CBC  . COMPLETE METABOLIC PANEL WITH GFR  . Lipid panel  . HgB A1c  . HIV Antibody (routine testing w rflx)  . Hepatitis C Antibody  . TSH  . Ambulatory referral to Obstetrics / Gynecology  . LONG TERM MONITOR (3-14 DAYS)  . EKG 12-Lead  . ECHOCARDIOGRAM COMPLETE  . Home sleep test    No orders of the defined types were placed in this encounter.   Patient Instructions  Preventive Care 12-86 Years Old, Female Preventive care refers to visits with your health care provider and lifestyle choices that can promote health  and wellness. This includes:  A yearly physical exam. This may also be called an annual well check.  Regular dental visits and eye exams.  Immunizations.  Screening for certain conditions.  Healthy lifestyle choices, such as eating a healthy diet, getting regular exercise, not using drugs or products that contain nicotine and tobacco, and limiting alcohol use. What can I expect for my preventive care visit? Physical exam Your health care provider will check your:  Height and weight. This may be used to calculate body mass index (BMI), which tells if you are at a healthy weight.  Heart rate and blood  pressure.  Skin for abnormal spots. Counseling Your health care provider may ask you questions about your:  Alcohol, tobacco, and drug use.  Emotional well-being.  Home and relationship well-being.  Sexual activity.  Eating habits.  Work and work Statistician.  Method of birth control.  Menstrual cycle.  Pregnancy history. What immunizations do I need?  Influenza (flu) vaccine  This is recommended every year. Tetanus, diphtheria, and pertussis (Tdap) vaccine  You may need a Td booster every 10 years. Varicella (chickenpox) vaccine  You may need this if you have not been vaccinated. Human papillomavirus (HPV) vaccine  If recommended by your health care provider, you may need three doses over 6 months. Measles, mumps, and rubella (MMR) vaccine  You may need at least one dose of MMR. You may also need a second dose. Meningococcal conjugate (MenACWY) vaccine  One dose is recommended if you are age 50-21 years and a first-year college student living in a residence hall, or if you have one of several medical conditions. You may also need additional booster doses. Pneumococcal conjugate (PCV13) vaccine  You may need this if you have certain conditions and were not previously vaccinated. Pneumococcal polysaccharide (PPSV23) vaccine  You may need one or two doses if you smoke cigarettes or if you have certain conditions. Hepatitis A vaccine  You may need this if you have certain conditions or if you travel or work in places where you may be exposed to hepatitis A. Hepatitis B vaccine  You may need this if you have certain conditions or if you travel or work in places where you may be exposed to hepatitis B. Haemophilus influenzae type b (Hib) vaccine  You may need this if you have certain conditions. You may receive vaccines as individual doses or as more than one vaccine together in one shot (combination vaccines). Talk with your health care provider about the risks  and benefits of combination vaccines. What tests do I need?  Blood tests  Lipid and cholesterol levels. These may be checked every 5 years starting at age 36.  Hepatitis C test.  Hepatitis B test. Screening  Diabetes screening. This is done by checking your blood sugar (glucose) after you have not eaten for a while (fasting).  Sexually transmitted disease (STD) testing.  BRCA-related cancer screening. This may be done if you have a family history of breast, ovarian, tubal, or peritoneal cancers.  Pelvic exam and Pap test. This may be done every 3 years starting at age 56. Starting at age 40, this may be done every 5 years if you have a Pap test in combination with an HPV test. Talk with your health care provider about your test results, treatment options, and if necessary, the need for more tests. Follow these instructions at home: Eating and drinking   Eat a diet that includes fresh fruits and vegetables, whole grains, lean  protein, and low-fat dairy.  Take vitamin and mineral supplements as recommended by your health care provider.  Do not drink alcohol if: ? Your health care provider tells you not to drink. ? You are pregnant, may be pregnant, or are planning to become pregnant.  If you drink alcohol: ? Limit how much you have to 0-1 drink a day. ? Be aware of how much alcohol is in your drink. In the [place] one drink equals one 12 oz bottle of beer (355 mL), one 5 oz glass of wine (148 mL), or one 1 oz glass of hard liquor (44 mL). Lifestyle  Take daily care of your teeth and gums.  Stay active. Exercise for at least 30 minutes on 5 or more days each week.  Do not use any products that contain nicotine or tobacco, such as cigarettes, e-cigarettes, and chewing tobacco. If you need help quitting, ask your health care provider.  If you are sexually active, practice safe sex. Use a condom or other form of birth control (contraception) in order to prevent pregnancy and STIs  (sexually transmitted infections). If you plan to become pregnant, see your health care provider for a preconception visit. What's next?  Visit your health care provider once a year for a well check visit.  Ask your health care provider how often you should have your eyes and teeth checked.  Stay up to date on all vaccines. This information is not intended to replace advice given to you by your health care provider. Make sure you discuss any questions you have with your health care provider. Document Revised: 05/02/2018 Document Reviewed: 05/02/2018 Elsevier Patient Education  [place].  Follow-up plan: Return in about 6 weeks (around 07/07/2020) for mood follow up.  Clearnce Sorrel, DNP, APRN, FNP-BC [facility]

## 2020-05-26 NOTE — Patient Instructions (Signed)
Preventive Care 20-36 Years Old, Female Preventive care refers to visits with your health care provider and lifestyle choices that can promote health and wellness. This includes:  A yearly physical exam. This may also be called an annual well check.  Regular dental visits and eye exams.  Immunizations.  Screening for certain conditions.  Healthy lifestyle choices, such as eating a healthy diet, getting regular exercise, not using drugs or products that contain nicotine and tobacco, and limiting alcohol use. What can I expect for my preventive care visit? Physical exam Your health care provider will check your:  Height and weight. This may be used to calculate body mass index (BMI), which tells if you are at a healthy weight.  Heart rate and blood pressure.  Skin for abnormal spots. Counseling Your health care provider may ask you questions about your:  Alcohol, tobacco, and drug use.  Emotional well-being.  Home and relationship well-being.  Sexual activity.  Eating habits.  Work and work Statistician.  Method of birth control.  Menstrual cycle.  Pregnancy history. What immunizations do I need?  Influenza (flu) vaccine  This is recommended every year. Tetanus, diphtheria, and pertussis (Tdap) vaccine  You may need a Td booster every 10 years. Varicella (chickenpox) vaccine  You may need this if you have not been vaccinated. Human papillomavirus (HPV) vaccine  If recommended by your health care provider, you may need three doses over 6 months. Measles, mumps, and rubella (MMR) vaccine  You may need at least one dose of MMR. You may also need a second dose. Meningococcal conjugate (MenACWY) vaccine  One dose is recommended if you are age 75-21 years and a first-year college student living in a residence hall, or if you have one of several medical conditions. You may also need additional booster doses. Pneumococcal conjugate (PCV13) vaccine  You may need  this if you have certain conditions and were not previously vaccinated. Pneumococcal polysaccharide (PPSV23) vaccine  You may need one or two doses if you smoke cigarettes or if you have certain conditions. Hepatitis A vaccine  You may need this if you have certain conditions or if you travel or work in places where you may be exposed to hepatitis A. Hepatitis B vaccine  You may need this if you have certain conditions or if you travel or work in places where you may be exposed to hepatitis B. Haemophilus influenzae type b (Hib) vaccine  You may need this if you have certain conditions. You may receive vaccines as individual doses or as more than one vaccine together in one shot (combination vaccines). Talk with your health care provider about the risks and benefits of combination vaccines. What tests do I need?  Blood tests  Lipid and cholesterol levels. These may be checked every 5 years starting at age 33.  Hepatitis C test.  Hepatitis B test. Screening  Diabetes screening. This is done by checking your blood sugar (glucose) after you have not eaten for a while (fasting).  Sexually transmitted disease (STD) testing.  BRCA-related cancer screening. This may be done if you have a family history of breast, ovarian, tubal, or peritoneal cancers.  Pelvic exam and Pap test. This may be done every 3 years starting at age 76. Starting at age 102, this may be done every 5 years if you have a Pap test in combination with an HPV test. Talk with your health care provider about your test results, treatment options, and if necessary, the need for more tests.  Follow these instructions at home: Eating and drinking   Eat a diet that includes fresh fruits and vegetables, whole grains, lean protein, and low-fat dairy.  Take vitamin and mineral supplements as recommended by your health care provider.  Do not drink alcohol if: ? Your health care provider tells you not to drink. ? You are  pregnant, may be pregnant, or are planning to become pregnant.  If you drink alcohol: ? Limit how much you have to 0-1 drink a day. ? Be aware of how much alcohol is in your drink. In the [place] one drink equals one 12 oz bottle of beer (355 mL), one 5 oz glass of wine (148 mL), or one 1 oz glass of hard liquor (44 mL). Lifestyle  Take daily care of your teeth and gums.  Stay active. Exercise for at least 30 minutes on 5 or more days each week.  Do not use any products that contain nicotine or tobacco, such as cigarettes, e-cigarettes, and chewing tobacco. If you need help quitting, ask your health care provider.  If you are sexually active, practice safe sex. Use a condom or other form of birth control (contraception) in order to prevent pregnancy and STIs (sexually transmitted infections). If you plan to become pregnant, see your health care provider for a preconception visit. What's next?  Visit your health care provider once a year for a well check visit.  Ask your health care provider how often you should have your eyes and teeth checked.  Stay up to date on all vaccines. This information is not intended to replace advice given to you by your health care provider. Make sure you discuss any questions you have with your health care provider. Document Revised: 05/02/2018 Document Reviewed: 05/02/2018 Elsevier Patient Education  2020 [employer].

## 2020-05-27 LAB — COMPLETE METABOLIC PANEL WITH GFR
AG Ratio: 1 (calc) (ref 1.0–2.5)
ALT: 7 U/L (ref 6–29)
AST: 9 U/L — ABNORMAL LOW (ref 10–30)
Albumin: 3.1 g/dL — ABNORMAL LOW (ref 3.6–5.1)
Alkaline phosphatase (APISO): 76 U/L (ref 31–125)
BUN/Creatinine Ratio: 10 (calc) (ref 6–22)
BUN: 13 mg/dL (ref 7–25)
CO2: 28 mmol/L (ref 20–32)
Calcium: 8.7 mg/dL (ref 8.6–10.2)
Chloride: 104 mmol/L (ref 98–110)
Creat: 1.25 mg/dL — ABNORMAL HIGH (ref 0.50–1.10)
GFR, Est African American: 65 mL/min/{1.73_m2} (ref >=60)
GFR, Est Non African American: 56 mL/min/{1.73_m2} — ABNORMAL LOW (ref >=60)
Globulin: 3.2 g/dL (calc) (ref 1.9–3.7)
Glucose, Bld: 88 mg/dL (ref 65–99)
Potassium: 4 mmol/L (ref 3.5–5.3)
Sodium: 138 mmol/L (ref 135–146)
Total Bilirubin: 0.3 mg/dL (ref 0.2–1.2)
Total Protein: 6.3 g/dL (ref 6.1–8.1)

## 2020-05-27 LAB — CBC
HCT: 34.9 % — ABNORMAL LOW (ref 35.0–45.0)
Hemoglobin: 11.6 g/dL — ABNORMAL LOW (ref 11.7–15.5)
MCH: 28.3 pg (ref 27.0–33.0)
MCHC: 33.2 g/dL (ref 32.0–36.0)
MCV: 85.1 fL (ref 80.0–100.0)
MPV: 11.9 fL (ref 7.5–12.5)
Platelets: 291 10*3/uL (ref 140–400)
RBC: 4.1 10*6/uL (ref 3.80–5.10)
RDW: 13.5 % (ref 11.0–15.0)
WBC: 7.2 10*3/uL (ref 3.8–10.8)

## 2020-05-27 LAB — HIV ANTIBODY (ROUTINE TESTING W REFLEX): HIV 1&2 Ab, 4th Generation: NONREACTIVE

## 2020-05-27 LAB — LIPID PANEL
Cholesterol: 225 mg/dL — ABNORMAL HIGH (ref <200)
HDL: 45 mg/dL — ABNORMAL LOW (ref >=50)
LDL Cholesterol (Calc): 157 mg/dL (calc) — ABNORMAL HIGH
Non-HDL Cholesterol (Calc): 180 mg/dL (calc) — ABNORMAL HIGH (ref <130)
Total CHOL/HDL Ratio: 5 (calc) — ABNORMAL HIGH (ref <5.0)
Triglycerides: 109 mg/dL (ref <150)

## 2020-05-27 LAB — HEMOGLOBIN A1C
Hgb A1c MFr Bld: 5.8 % of total Hgb — ABNORMAL HIGH (ref <5.7)
Mean Plasma Glucose: 120 (calc)
eAG (mmol/L): 6.6 (calc)

## 2020-05-27 LAB — HEPATITIS C ANTIBODY
Hepatitis C Ab: NONREACTIVE
SIGNAL TO CUT-OFF: 0.02 (ref <1.00)

## 2020-05-27 LAB — TSH: TSH: 1.97 mIU/L

## 2020-05-31 ENCOUNTER — Ambulatory Visit (INDEPENDENT_AMBULATORY_CARE_PROVIDER_SITE_OTHER): Payer: BC Managed Care – PPO

## 2020-05-31 DIAGNOSIS — R002 Palpitations: Secondary | ICD-10-CM

## 2020-06-16 ENCOUNTER — Other Ambulatory Visit: Payer: Self-pay

## 2020-06-16 ENCOUNTER — Ambulatory Visit (HOSPITAL_BASED_OUTPATIENT_CLINIC_OR_DEPARTMENT_OTHER)
Admission: RE | Admit: 2020-06-16 | Discharge: 2020-06-16 | Disposition: A | Discharge status: Home / Self Care | Payer: BC Managed Care – PPO | Source: Ambulatory Visit | Attending: Medical-Surgical | Admitting: Medical-Surgical

## 2020-06-16 DIAGNOSIS — R002 Palpitations: Secondary | ICD-10-CM | POA: Diagnosis present

## 2020-06-16 LAB — ECHOCARDIOGRAM COMPLETE
Area-P 1/2: 3.27 cm2
S' Lateral: 3.4 cm

## 2020-06-24 ENCOUNTER — Encounter: Payer: Self-pay | Admitting: Obstetrics and Gynecology

## 2020-06-24 ENCOUNTER — Ambulatory Visit (INDEPENDENT_AMBULATORY_CARE_PROVIDER_SITE_OTHER): Payer: BC Managed Care – PPO | Admitting: Obstetrics and Gynecology

## 2020-06-24 ENCOUNTER — Other Ambulatory Visit: Payer: Self-pay

## 2020-06-24 ENCOUNTER — Other Ambulatory Visit (HOSPITAL_COMMUNITY)
Admission: RE | Admit: 2020-06-24 | Discharge: 2020-06-24 | Disposition: A | Discharge status: Home / Self Care | Payer: BC Managed Care – PPO | Source: Ambulatory Visit | Attending: Obstetrics and Gynecology | Admitting: Obstetrics and Gynecology

## 2020-06-24 VITALS — BP 137/86 | HR 105 | Resp 16 | Ht 64.0 in | Wt 238.0 lb

## 2020-06-24 DIAGNOSIS — Z124 Encounter for screening for malignant neoplasm of cervix: Secondary | ICD-10-CM

## 2020-06-24 DIAGNOSIS — Z3009 Encounter for other general counseling and advice on contraception: Secondary | ICD-10-CM

## 2020-06-24 NOTE — Progress Notes (Signed)
° °  GYNECOLOGY OFFICE NOTE  History:  34 y.o. [id number] here today for discussion of contraception. She does not want to be pregnant, wanted to have tubal ligation after birth of her last child but was told she would have too much scar tissue.  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   Review of Systems:  Pertinent items noted in HPI and remainder of comprehensive ROS otherwise negative.   Objective:  Physical Exam BP 137/86    Pulse (!) 105    Resp 16    Ht 5\' 4"  (1.626 m)    Wt 238 lb (108 kg)    LMP 06/20/2020    BMI 40.85 kg/m  CONSTITUTIONAL: Well-developed, well-nourished female in no acute distress.  HENT:  Normocephalic, atraumatic. External right and left ear normal. Oropharynx is clear and moist EYES: Conjunctivae and EOM are normal. Pupils are equal, round, and reactive to light. No scleral icterus.  NECK: Normal range of motion, supple, no masses SKIN: Skin is warm and dry. No rash noted. Not diaphoretic. No erythema. No pallor. NEUROLOGIC: Alert and oriented to person, place, and time. Normal reflexes, muscle tone coordination. No cranial nerve deficit noted. PSYCHIATRIC: Normal mood and affect. Normal  behavior. Normal judgment and thought content. CARDIOVASCULAR: Normal heart rate noted RESPIRATORY: Effort normal, no problems with respiration noted ABDOMEN: Soft, no distention noted.   PELVIC: Normal appearing external genitalia; normal appearing vaginal mucosa and cervix.  No abnormal discharge noted.  Pap smear obtained.   MUSCULOSKELETAL: Normal range of motion. No edema noted.  Exam done with chaperone present.  Labs and Imaging   Assessment & Plan:   1. Cervical cancer screening H/o abnormal pap with LEEP/cryo Pap today  2. Encounter for counseling regarding contraception - Reviewed tubal ligation, risks of laparoscopic surgery including infection, hemorrhage, damage to intestines - with unknown amount of scarring and incision in upper abdomen, would be reasonable to take for laparoscopic tubal ligation - reviewed risks/benefits of IUD as well - she will try IUD and then if dislikes IUD, will plan for lap BTL   Routine preventative health maintenance measures emphasized. Please refer to After Visit Summary for other counseling recommendations.   Return in about 4 weeks (around 07/22/2020) for IUD insertion.  Total face-to-face time with patient: 25 minutes. Over 50% of encounter was spent on counseling and coordination of care.  Feliz Beam, M.D. Attending Center for [employer])

## 2020-06-25 LAB — CYTOLOGY - PAP
Comment: NEGATIVE
Diagnosis: NEGATIVE
High risk HPV: NEGATIVE

## 2020-06-28 NOTE — Progress Notes (Addendum)
Virtual Visit via Video Note  Subjective:    I connected with Lindsay Hall on 06/30/20 by a video enabled telemedicine application and verified that I am speaking with the correct person using two identifiers.   I discussed the limitations of evaluation and management by telemedicine and the availability of in person appointments. The patient expressed understanding and agreed to proceed.  Patient location: home Provider location: medical office  CC: fever, left chest gas  HPI: Pleasant 34 year old female presenting via MyChart video visit for  follow up from hospital visit for LUQ pain. Was diagnosed at that time with a UTI but a small left pleural effusion was also found on CT. She was treated in the ED with Rocephin and sent home with a prescription for Keflex x 7 days. Labs in the ED showed significant proteinuria, worsened kidney function (GFR 50, creatinine 1.55), normocytic anemia, and leukocytosis at 12.3.  Today, she presents via MyChart video visit with reports of chest pressure, left chest pain, chest congestion, mild cough productive of white phlegm, fever t-max 101.7, chills, decreased appetite, PND, increased belching, bloating, and brain fog. She has been taking the Keflex as prescribed but no other medications or supplements. She was not COVID tested at the hospital and reports she was unaware that her CT showed a pleural effusion.   I reviewed the past medical history, family history, social history, surgical history, and allergies today and no changes were needed.  Please see the problem list section below in epic for further details.  Past Medical History: Past Medical History:  Diagnosis Date  . Cervical dysplasia   . Hx of migraines    Past Surgical History: Past Surgical History:  Procedure Laterality Date  . CERVICAL ABLATION  2009  . LEEP    . 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 Marital status: Significant Other    Spouse name: Not on file  . Number of children: Not on file  . Years of education: Not on file  . Highest education level: Not on file  Occupational History  . Not on file  Tobacco Use  . Smoking status: Never Smoker  . Smokeless tobacco: Never Used  Vaping Use  . Vaping Use: Never used  Substance and Sexual Activity  . Alcohol use: No  . Drug use: No  . Sexual activity: Yes    Birth control/protection: None  Other Topics Concern  . Not on file  Social History Narrative  . Not on file   Social Determinants of Health   Financial Resource Strain:   . Difficulty of Paying Living Expenses: Not on file  Food Insecurity:   . Worried About Charity fundraiser in the Last Year: Not on file  . Ran Out of Food in the Last Year: Not on file  Transportation Needs:   . Lack of Transportation (Medical): Not on file  . Lack of Transportation (Non-Medical): Not on file  Physical Activity:   . Days of Exercise per Week: Not on file  . Minutes of Exercise per Session: Not on file  Stress:   . Feeling of Stress : Not on file  Social Connections:   . Frequency of Communication with Friends and Family: Not on file  . Frequency of Social Gatherings with Friends and Family: Not on file  .  Attends Religious Services: Not on file  . Active Member of Clubs or Organizations: Not on file  . Attends Archivist Meetings: Not on file  . Marital Status: Not on file   Family History: Family History  Problem Relation Age of Onset  . Hypertension Mother   . Migraines Mother   . Migraines Sister   . Hypertension Brother   . Migraines Brother   . Hypertension Maternal Grandmother    Allergies: Allergies  Allergen Reactions  . Shellfish-Derived Products Other (See Comments)    No reaction listed   Medications: See med rec.  Review of  Systems: See HPI for pertinent positives and negatives.   Objective:    General: Well Developed, well nourished, and in no acute distress.  Neuro: Alert and oriented x3, extra-ocular muscles intact, sensation grossly intact.  HEENT: Normocephalic, atraumatic, pupils equal round reactive to light, neck supple, no masses, no lymphadenopathy, thyroid nonpalpable.  Skin: Warm and dry, no rashes. Cardiac: Regular rate and rhythm, no murmurs rubs or gallops, no lower extremity edema.  Respiratory: Clear to auscultation bilaterally. Not using accessory muscles, speaking in full sentences.   Impression and Recommendations:    1. Pleural effusion/LUQ pain/Respiratory symptoms Rechecking CBC with diff. Stat CXR. Okay to use Tylenol and Ibuprofen for pain/fever. - CBC with Differential/Platelet - DG Chest 2 View; Future  2. Fever, unspecified fever cause Checking CBC with diff. Stat CXR. Rechecking UA and culture. Drive up COVID swab. Okay to use Tylenol and Ibuprofen for pain/fever.  - CBC with Differential/Platelet - DG Chest 2 View; Future - Urinalysis, Routine w reflex microscopic - Urine Culture - Novel Coronavirus, NAA ([facility])  3. Urinary tract infection with hematuria, site unspecified Rechecking urinalysis. Sending for culture for sensitivities. - Urinalysis, Routine w reflex microscopic - Urine Culture  4. Renal insufficiency Rechecking CMP.  - COMPLETE METABOLIC PANEL WITH GFR  Return if symptoms worsen or fail to improve.   20 minutes of non face-to-face time was provided during this encounter. ___________________________________________ Clearnce Sorrel, DNP, APRN, FNP-BC [facility] [place]

## 2020-06-29 ENCOUNTER — Encounter: Payer: Self-pay | Admitting: Medical-Surgical

## 2020-06-29 ENCOUNTER — Telehealth (INDEPENDENT_AMBULATORY_CARE_PROVIDER_SITE_OTHER): Payer: BC Managed Care – PPO | Admitting: Medical-Surgical

## 2020-06-29 ENCOUNTER — Ambulatory Visit (INDEPENDENT_AMBULATORY_CARE_PROVIDER_SITE_OTHER): Payer: BC Managed Care – PPO

## 2020-06-29 ENCOUNTER — Other Ambulatory Visit: Payer: Self-pay

## 2020-06-29 VITALS — BP 142/76 | Temp 99.6°F

## 2020-06-29 DIAGNOSIS — R1012 Left upper quadrant pain: Secondary | ICD-10-CM

## 2020-06-29 DIAGNOSIS — R0989 Other specified symptoms and signs involving the circulatory and respiratory systems: Secondary | ICD-10-CM

## 2020-06-29 DIAGNOSIS — N390 Urinary tract infection, site not specified: Secondary | ICD-10-CM

## 2020-06-29 DIAGNOSIS — N289 Disorder of kidney and ureter, unspecified: Secondary | ICD-10-CM

## 2020-06-29 DIAGNOSIS — J189 Pneumonia, unspecified organism: Secondary | ICD-10-CM | POA: Diagnosis not present

## 2020-06-29 DIAGNOSIS — R000 Tachycardia, unspecified: Secondary | ICD-10-CM | POA: Diagnosis not present

## 2020-06-29 DIAGNOSIS — R509 Fever, unspecified: Secondary | ICD-10-CM | POA: Diagnosis not present

## 2020-06-29 DIAGNOSIS — J181 Lobar pneumonia, unspecified organism: Secondary | ICD-10-CM

## 2020-06-29 DIAGNOSIS — N179 Acute kidney failure, unspecified: Secondary | ICD-10-CM | POA: Diagnosis not present

## 2020-06-29 DIAGNOSIS — A419 Sepsis, unspecified organism: Secondary | ICD-10-CM | POA: Diagnosis not present

## 2020-06-29 DIAGNOSIS — J90 Pleural effusion, not elsewhere classified: Secondary | ICD-10-CM

## 2020-06-29 DIAGNOSIS — R319 Hematuria, unspecified: Secondary | ICD-10-CM

## 2020-06-29 DIAGNOSIS — Z20822 Contact with and (suspected) exposure to covid-19: Secondary | ICD-10-CM | POA: Diagnosis not present

## 2020-06-29 DIAGNOSIS — R6520 Severe sepsis without septic shock: Secondary | ICD-10-CM | POA: Diagnosis not present

## 2020-06-29 DIAGNOSIS — R778 Other specified abnormalities of plasma proteins: Secondary | ICD-10-CM | POA: Diagnosis not present

## 2020-06-29 DIAGNOSIS — M7989 Other specified soft tissue disorders: Secondary | ICD-10-CM | POA: Diagnosis not present

## 2020-06-29 MED ORDER — HYDROCODONE-HOMATROPINE 5-1.5 MG/5ML PO SYRP: 5.0000 mL | ORAL_SOLUTION | Freq: Three times a day (TID) | ORAL | 0 refills | Status: DC | PRN

## 2020-06-30 ENCOUNTER — Other Ambulatory Visit: Payer: Self-pay | Admitting: Medical-Surgical

## 2020-06-30 DIAGNOSIS — J90 Pleural effusion, not elsewhere classified: Secondary | ICD-10-CM | POA: Insufficient documentation

## 2020-06-30 DIAGNOSIS — R0602 Shortness of breath: Secondary | ICD-10-CM | POA: Diagnosis not present

## 2020-06-30 DIAGNOSIS — J189 Pneumonia, unspecified organism: Secondary | ICD-10-CM | POA: Diagnosis not present

## 2020-06-30 DIAGNOSIS — R6520 Severe sepsis without septic shock: Secondary | ICD-10-CM | POA: Insufficient documentation

## 2020-06-30 DIAGNOSIS — N179 Acute kidney failure, unspecified: Secondary | ICD-10-CM | POA: Insufficient documentation

## 2020-06-30 DIAGNOSIS — I150 Renovascular hypertension: Secondary | ICD-10-CM | POA: Insufficient documentation

## 2020-06-30 DIAGNOSIS — R000 Tachycardia, unspecified: Secondary | ICD-10-CM | POA: Diagnosis not present

## 2020-06-30 DIAGNOSIS — I10 Essential (primary) hypertension: Secondary | ICD-10-CM | POA: Insufficient documentation

## 2020-06-30 DIAGNOSIS — N83201 Unspecified ovarian cyst, right side: Secondary | ICD-10-CM | POA: Diagnosis not present

## 2020-06-30 DIAGNOSIS — A419 Sepsis, unspecified organism: Secondary | ICD-10-CM | POA: Insufficient documentation

## 2020-06-30 DIAGNOSIS — R918 Other nonspecific abnormal finding of lung field: Secondary | ICD-10-CM | POA: Diagnosis not present

## 2020-06-30 HISTORY — DX: Acute kidney failure, unspecified: N17.9

## 2020-06-30 HISTORY — DX: Sepsis, unspecified organism: A41.9

## 2020-06-30 LAB — SARS-COV-2, NAA 2 DAY TAT

## 2020-06-30 LAB — NOVEL CORONAVIRUS, NAA: SARS-CoV-2, NAA: NOT DETECTED

## 2020-07-01 DIAGNOSIS — R6520 Severe sepsis without septic shock: Secondary | ICD-10-CM | POA: Diagnosis not present

## 2020-07-01 DIAGNOSIS — I150 Renovascular hypertension: Secondary | ICD-10-CM | POA: Diagnosis not present

## 2020-07-01 DIAGNOSIS — J189 Pneumonia, unspecified organism: Secondary | ICD-10-CM | POA: Diagnosis not present

## 2020-07-01 DIAGNOSIS — N179 Acute kidney failure, unspecified: Secondary | ICD-10-CM | POA: Diagnosis not present

## 2020-07-01 DIAGNOSIS — A419 Sepsis, unspecified organism: Secondary | ICD-10-CM | POA: Diagnosis not present

## 2020-07-02 DIAGNOSIS — A419 Sepsis, unspecified organism: Secondary | ICD-10-CM | POA: Diagnosis not present

## 2020-07-02 DIAGNOSIS — J189 Pneumonia, unspecified organism: Secondary | ICD-10-CM | POA: Diagnosis not present

## 2020-07-02 DIAGNOSIS — N179 Acute kidney failure, unspecified: Secondary | ICD-10-CM | POA: Diagnosis not present

## 2020-07-02 DIAGNOSIS — K5900 Constipation, unspecified: Secondary | ICD-10-CM | POA: Insufficient documentation

## 2020-07-02 DIAGNOSIS — I150 Renovascular hypertension: Secondary | ICD-10-CM | POA: Diagnosis not present

## 2020-07-02 DIAGNOSIS — R6520 Severe sepsis without septic shock: Secondary | ICD-10-CM | POA: Diagnosis not present

## 2020-07-03 DIAGNOSIS — J189 Pneumonia, unspecified organism: Secondary | ICD-10-CM | POA: Diagnosis not present

## 2020-07-03 DIAGNOSIS — N179 Acute kidney failure, unspecified: Secondary | ICD-10-CM | POA: Diagnosis not present

## 2020-07-03 DIAGNOSIS — A419 Sepsis, unspecified organism: Secondary | ICD-10-CM | POA: Diagnosis not present

## 2020-07-03 DIAGNOSIS — R6520 Severe sepsis without septic shock: Secondary | ICD-10-CM | POA: Diagnosis not present

## 2020-07-03 DIAGNOSIS — I150 Renovascular hypertension: Secondary | ICD-10-CM | POA: Diagnosis not present

## 2020-07-03 LAB — CBC WITH DIFFERENTIAL/PLATELET

## 2020-07-03 LAB — URINE CULTURE

## 2020-07-03 LAB — URINALYSIS, ROUTINE W REFLEX MICROSCOPIC

## 2020-07-03 LAB — COMPLETE METABOLIC PANEL WITH GFR

## 2020-07-04 DIAGNOSIS — I150 Renovascular hypertension: Secondary | ICD-10-CM | POA: Diagnosis not present

## 2020-07-04 DIAGNOSIS — J189 Pneumonia, unspecified organism: Secondary | ICD-10-CM | POA: Diagnosis not present

## 2020-07-04 DIAGNOSIS — A419 Sepsis, unspecified organism: Secondary | ICD-10-CM | POA: Diagnosis not present

## 2020-07-04 DIAGNOSIS — N179 Acute kidney failure, unspecified: Secondary | ICD-10-CM | POA: Diagnosis not present

## 2020-07-04 DIAGNOSIS — R6520 Severe sepsis without septic shock: Secondary | ICD-10-CM | POA: Diagnosis not present

## 2020-07-07 ENCOUNTER — Ambulatory Visit: Payer: BC Managed Care – PPO | Admitting: Medical-Surgical

## 2020-07-12 ENCOUNTER — Ambulatory Visit (INDEPENDENT_AMBULATORY_CARE_PROVIDER_SITE_OTHER): Payer: Self-pay | Admitting: Medical-Surgical

## 2020-07-12 ENCOUNTER — Other Ambulatory Visit: Payer: Self-pay

## 2020-07-12 ENCOUNTER — Encounter: Payer: Self-pay | Admitting: Medical-Surgical

## 2020-07-12 ENCOUNTER — Ambulatory Visit (INDEPENDENT_AMBULATORY_CARE_PROVIDER_SITE_OTHER): Payer: Self-pay

## 2020-07-12 VITALS — BP 128/74 | HR 104 | Temp 98.4°F | Ht 64.0 in | Wt 235.8 lb

## 2020-07-12 DIAGNOSIS — J90 Pleural effusion, not elsewhere classified: Secondary | ICD-10-CM

## 2020-07-12 DIAGNOSIS — J189 Pneumonia, unspecified organism: Secondary | ICD-10-CM

## 2020-07-12 DIAGNOSIS — N179 Acute kidney failure, unspecified: Secondary | ICD-10-CM

## 2020-07-12 DIAGNOSIS — R801 Persistent proteinuria, unspecified: Secondary | ICD-10-CM

## 2020-07-12 DIAGNOSIS — D649 Anemia, unspecified: Secondary | ICD-10-CM | POA: Diagnosis not present

## 2020-07-12 DIAGNOSIS — Z09 Encounter for follow-up examination after completed treatment for conditions other than malignant neoplasm: Secondary | ICD-10-CM

## 2020-07-12 LAB — POCT URINALYSIS DIPSTICK
Bilirubin, UA: NEGATIVE
Glucose, UA: NEGATIVE
Ketones, UA: NEGATIVE
Nitrite, UA: NEGATIVE
Odor: NEGATIVE
Protein, UA: POSITIVE — AB
Spec Grav, UA: 1.025 (ref 1.010–1.025)
Urobilinogen, UA: 0.2 E.U./dL
pH, UA: 7.5 (ref 5.0–8.0)

## 2020-07-12 NOTE — Progress Notes (Signed)
Subjective:    CC: hospital discharge follow up  HPI: Pleasant 34 year old female presenting today for hospital discharge follow-up.  She was hospitalized on 10/26-11/1 at [facility].  She was diagnosed with loculated left pleural effusion, left lower lobe pneumonia, AKI, anemia, and sepsis.  During her hospitalization she was treated with IV antibiotics.  They also did a thoracentesis that required several attempts before being done under IR guidance.  She was discharged 1 week ago with instructions to follow-up with her PCP to have her labs rechecked.  Today she presents with reports of continued shortness of breath and desaturations as low as 80-85% on room air with exertion (climbing 1 flight of stairs).  At rest her oxygen levels are in the 90s.  She continues to have some substernal chest pain as well as some left thoracic back pain at the site of her thoracentesis.  She has a significant nonproductive cough that is very painful.  Notes that she is also having urinary discoloration.  At times her urine is yellow with a slight red tinge but today it is bright red.  Reports that she was not set up for a nephrologist or a pulmonologist at her discharge.  On discharge, she was told to take Robitussin-DM instead of the prescription that had been sent for her on 10/26.  The Robitussin-DM has not been helpful at all for managing her cough.  She is having significant difficulty sleeping at night and has trouble getting comfortable due to back pain and breathing issues.  On review of her chart, it does not look like a repeat chest x-ray was completed after her thoracentesis.  Her last kidney function showed continued elevation of creatinine at 2.05.  She has been hydrating well, drinking approximately 5 16 ounce bottles of water each day.  Denies fever, chills, dysuria.  I reviewed the past medical history, family history, social history, surgical history, and allergies today and no  changes were needed.  Please see the problem list section below in epic for further details.  Past Medical History: Past Medical History:  Diagnosis Date  . Cervical dysplasia   . Hx of migraines    Past Surgical History: Past Surgical History:  Procedure Laterality Date  . CERVICAL ABLATION  2009  . LEEP    . 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 Marital status: Significant Other    Spouse name: Not on file  . Number of children: Not on file  . Years of education: Not on file  . Highest education level: Not on file  Occupational History  . Not on file  Tobacco Use  . Smoking status: Never Smoker  . Smokeless tobacco: Never Used  Vaping Use  . Vaping Use: Never used  Substance and Sexual Activity  . Alcohol use: No  . Drug use: No  . Sexual activity: Yes    Birth control/protection: None  Other Topics Concern  . Not on file  Social History Narrative  . Not on file   Social Determinants of Health   Financial Resource Strain:   . Difficulty of Paying Living Expenses: Not on file  Food Insecurity:   . Worried About Charity fundraiser in the Last Year: Not on file  . Ran Out of Food in the Last Year: Not  on file  Transportation Needs:   . Lack of Transportation (Medical): Not on file  . Lack of Transportation (Non-Medical): Not on file  Physical Activity:   . Days of Exercise per Week: Not on file  . Minutes of Exercise per Session: Not on file  Stress:   . Feeling of Stress : Not on file  Social Connections:   . Frequency of Communication with Friends and Family: Not on file  . Frequency of Social Gatherings with Friends and Family: Not on file  . Attends Religious Services: Not on file  . Active Member of Clubs or Organizations: Not on file  . Attends Archivist  Meetings: Not on file  . Marital Status: Not on file   Family History: Family History  Problem Relation Age of Onset  . Hypertension Mother   . Migraines Mother   . Migraines Sister   . Hypertension Brother   . Migraines Brother   . Hypertension Maternal Grandmother    Allergies: Allergies  Allergen Reactions  . Shellfish-Derived Products Other (See Comments)    No reaction listed   Medications: See med rec.  Review of Systems: See HPI for pertinent positives and negatives.   Objective:    General: Well Developed, well nourished, and in no acute distress.  Neuro: Alert and oriented x3.  HEENT: Normocephalic, atraumatic.  Skin: Warm and dry. Cardiac: Regular rate and rhythm, no murmurs rubs or gallops, no lower extremity edema.  Respiratory: Clear to auscultation in all right lung fields and the left upper lobe.  Very diminished lung sounds on the left lower lobe posteriorly. Not using accessory muscles, speaking in full sentences.   Impression and Recommendations:    1. Hospital discharge follow-up Reviewed records in care everywhere regarding her hospitalization.  2. Acute kidney injury ([place]) POCT UA showing large blood, greater than 300 protein, and trace leukocytes.  Sending for culture.  Rechecking BMP with GFR.  Continue hydrating well and monitor for worsening shortness of breath and development of peripheral edema. - BASIC METABOLIC PANEL WITH GFR - POCT Urinalysis Dipstick - Urine Culture  3. Anemia, unspecified type Rechecking CBC. - CBC  4. Loculated pleural effusion Concern for continued desaturations as well as significantly diminished lung sounds over the left lower lobe.  Stat chest x-ray. - DG Chest 2 View; Future  5. Pneumonia of left lower lobe due to infectious organism Stat chest x-ray. - DG Chest 2 View; Future  Return if symptoms worsen or fail to improve. ___________________________________________ Clearnce Sorrel, DNP, APRN,  FNP-BC [facility] [place]

## 2020-07-13 ENCOUNTER — Encounter: Payer: Self-pay | Admitting: Medical-Surgical

## 2020-07-13 ENCOUNTER — Other Ambulatory Visit: Payer: Self-pay | Admitting: Medical-Surgical

## 2020-07-13 DIAGNOSIS — J90 Pleural effusion, not elsewhere classified: Secondary | ICD-10-CM

## 2020-07-13 LAB — BASIC METABOLIC PANEL WITH GFR
BUN/Creatinine Ratio: 6 (calc) (ref 6–22)
BUN: 12 mg/dL (ref 7–25)
CO2: 23 mmol/L (ref 20–32)
Calcium: 8.4 mg/dL — ABNORMAL LOW (ref 8.6–10.2)
Chloride: 101 mmol/L (ref 98–110)
Creat: 1.88 mg/dL — ABNORMAL HIGH (ref 0.50–1.10)
GFR, Est African American: 39 mL/min/{1.73_m2} — ABNORMAL LOW (ref >=60)
GFR, Est Non African American: 34 mL/min/{1.73_m2} — ABNORMAL LOW (ref >=60)
Glucose, Bld: 77 mg/dL (ref 65–99)
Potassium: 4.4 mmol/L (ref 3.5–5.3)
Sodium: 136 mmol/L (ref 135–146)

## 2020-07-13 LAB — CBC
HCT: 25.2 % — ABNORMAL LOW (ref 35.0–45.0)
Hemoglobin: 7.9 g/dL — ABNORMAL LOW (ref 11.7–15.5)
MCH: 27 pg (ref 27.0–33.0)
MCHC: 31.3 g/dL — ABNORMAL LOW (ref 32.0–36.0)
MCV: 86 fL (ref 80.0–100.0)
MPV: 10.5 fL (ref 7.5–12.5)
Platelets: 428 10*3/uL — ABNORMAL HIGH (ref 140–400)
RBC: 2.93 10*6/uL — ABNORMAL LOW (ref 3.80–5.10)
RDW: 12.9 % (ref 11.0–15.0)
WBC: 12.8 10*3/uL — ABNORMAL HIGH (ref 3.8–10.8)

## 2020-07-13 NOTE — Addendum Note (Signed)
Addended bySamuel Bouche on: 07/13/2020 01:30 PM   Modules accepted: Orders

## 2020-07-14 ENCOUNTER — Encounter: Payer: Self-pay | Admitting: *Deleted

## 2020-07-14 ENCOUNTER — Other Ambulatory Visit: Payer: Self-pay

## 2020-07-14 DIAGNOSIS — N179 Acute kidney failure, unspecified: Secondary | ICD-10-CM

## 2020-07-14 DIAGNOSIS — R801 Persistent proteinuria, unspecified: Secondary | ICD-10-CM

## 2020-07-14 DIAGNOSIS — N390 Urinary tract infection, site not specified: Secondary | ICD-10-CM

## 2020-07-14 DIAGNOSIS — N289 Disorder of kidney and ureter, unspecified: Secondary | ICD-10-CM

## 2020-07-14 LAB — URINE CULTURE
MICRO NUMBER:: 11176335
SPECIMEN QUALITY:: ADEQUATE

## 2020-07-15 LAB — URINE CULTURE
MICRO NUMBER:: 11186524
SPECIMEN QUALITY:: ADEQUATE

## 2020-07-18 ENCOUNTER — Encounter: Payer: Self-pay | Admitting: Medical-Surgical

## 2020-07-18 DIAGNOSIS — N838 Other noninflammatory disorders of ovary, fallopian tube and broad ligament: Secondary | ICD-10-CM

## 2020-07-18 DIAGNOSIS — R634 Abnormal weight loss: Secondary | ICD-10-CM

## 2020-07-21 LAB — PROTEIN ELECTROPHORESIS,RANDOM URN
Albumin: 47 %
Alpha-1-Globulin, U: 12 %
Alpha-2-Globulin, U: 16 %
Beta Globulin, U: 8 %
Creatinine, Urine: 83 mg/dL (ref 20–275)
Gamma Globulin, U: 18 %
Protein/Creat Ratio: 6711 mg/g creat — ABNORMAL HIGH (ref 21–161)
Protein/Creatinine Ratio: 6.711 mg/mg creat — ABNORMAL HIGH (ref 0.021–0.16)
Total Protein, Urine: 557 mg/dL — ABNORMAL HIGH (ref 5–24)

## 2020-07-21 LAB — URINALYSIS
Bilirubin Urine: NEGATIVE
Glucose, UA: NEGATIVE
Ketones, ur: NEGATIVE
Nitrite: NEGATIVE
Specific Gravity, Urine: 1.011 (ref 1.001–1.03)
pH: 7.5 (ref 5.0–8.0)

## 2020-07-21 LAB — TEST AUTHORIZATION

## 2020-07-21 LAB — MICROALBUMIN, URINE: Microalb, Ur: 169.3 mg/dL

## 2020-07-22 ENCOUNTER — Other Ambulatory Visit: Payer: Self-pay

## 2020-07-22 ENCOUNTER — Ambulatory Visit (INDEPENDENT_AMBULATORY_CARE_PROVIDER_SITE_OTHER): Payer: Self-pay

## 2020-07-22 ENCOUNTER — Ambulatory Visit: Payer: BC Managed Care – PPO | Admitting: Obstetrics and Gynecology

## 2020-07-22 ENCOUNTER — Other Ambulatory Visit: Payer: Self-pay | Admitting: Medical-Surgical

## 2020-07-22 DIAGNOSIS — R634 Abnormal weight loss: Secondary | ICD-10-CM

## 2020-07-22 DIAGNOSIS — N179 Acute kidney failure, unspecified: Secondary | ICD-10-CM

## 2020-07-22 DIAGNOSIS — R310 Gross hematuria: Secondary | ICD-10-CM

## 2020-07-22 DIAGNOSIS — R801 Persistent proteinuria, unspecified: Secondary | ICD-10-CM

## 2020-07-22 DIAGNOSIS — N838 Other noninflammatory disorders of ovary, fallopian tube and broad ligament: Secondary | ICD-10-CM

## 2020-07-22 NOTE — Progress Notes (Unsigned)
re

## 2020-07-23 ENCOUNTER — Encounter: Payer: Self-pay | Admitting: Medical-Surgical

## 2020-07-27 ENCOUNTER — Other Ambulatory Visit: Payer: Self-pay

## 2020-07-27 ENCOUNTER — Encounter: Payer: Self-pay | Admitting: Thoracic Surgery (Cardiothoracic Vascular Surgery)

## 2020-07-27 ENCOUNTER — Ambulatory Visit
Admission: RE | Admit: 2020-07-27 | Discharge: 2020-07-27 | Disposition: A | Discharge status: Home / Self Care | Payer: Self-pay | Source: Ambulatory Visit | Attending: Medical-Surgical | Admitting: Medical-Surgical

## 2020-07-27 ENCOUNTER — Inpatient Hospital Stay (HOSPITAL_COMMUNITY): Payer: Medicaid Other

## 2020-07-27 ENCOUNTER — Inpatient Hospital Stay (HOSPITAL_COMMUNITY)
Admission: AD | Admit: 2020-07-27 | Discharge: 2020-08-03 | DRG: 853 | Disposition: A | Discharge status: Home / Self Care | Payer: Medicaid Other | Source: Ambulatory Visit | Attending: Thoracic Surgery (Cardiothoracic Vascular Surgery) | Admitting: Thoracic Surgery (Cardiothoracic Vascular Surgery)

## 2020-07-27 ENCOUNTER — Other Ambulatory Visit: Payer: Self-pay | Admitting: Medical-Surgical

## 2020-07-27 ENCOUNTER — Other Ambulatory Visit: Payer: Self-pay | Admitting: Thoracic Surgery (Cardiothoracic Vascular Surgery)

## 2020-07-27 ENCOUNTER — Institutional Professional Consult (permissible substitution) (INDEPENDENT_AMBULATORY_CARE_PROVIDER_SITE_OTHER): Payer: Self-pay | Admitting: Thoracic Surgery (Cardiothoracic Vascular Surgery)

## 2020-07-27 VITALS — BP 117/70 | HR 148 | Temp 98.2°F | Resp 20 | Ht 64.0 in | Wt 220.0 lb

## 2020-07-27 DIAGNOSIS — E876 Hypokalemia: Secondary | ICD-10-CM | POA: Diagnosis present

## 2020-07-27 DIAGNOSIS — I150 Renovascular hypertension: Secondary | ICD-10-CM | POA: Diagnosis present

## 2020-07-27 DIAGNOSIS — J869 Pyothorax without fistula: Secondary | ICD-10-CM

## 2020-07-27 DIAGNOSIS — Z6837 Body mass index (BMI) 37.0-37.9, adult: Secondary | ICD-10-CM | POA: Diagnosis not present

## 2020-07-27 DIAGNOSIS — E860 Dehydration: Secondary | ICD-10-CM | POA: Diagnosis present

## 2020-07-27 DIAGNOSIS — J9811 Atelectasis: Secondary | ICD-10-CM | POA: Diagnosis present

## 2020-07-27 DIAGNOSIS — J90 Pleural effusion, not elsewhere classified: Secondary | ICD-10-CM

## 2020-07-27 DIAGNOSIS — D62 Acute posthemorrhagic anemia: Secondary | ICD-10-CM | POA: Diagnosis not present

## 2020-07-27 DIAGNOSIS — R112 Nausea with vomiting, unspecified: Secondary | ICD-10-CM | POA: Diagnosis present

## 2020-07-27 DIAGNOSIS — J95811 Postprocedural pneumothorax: Secondary | ICD-10-CM | POA: Diagnosis not present

## 2020-07-27 DIAGNOSIS — N179 Acute kidney failure, unspecified: Secondary | ICD-10-CM | POA: Diagnosis present

## 2020-07-27 DIAGNOSIS — J939 Pneumothorax, unspecified: Secondary | ICD-10-CM

## 2020-07-27 DIAGNOSIS — D72829 Elevated white blood cell count, unspecified: Secondary | ICD-10-CM | POA: Diagnosis present

## 2020-07-27 DIAGNOSIS — R091 Pleurisy: Secondary | ICD-10-CM | POA: Diagnosis not present

## 2020-07-27 DIAGNOSIS — R6520 Severe sepsis without septic shock: Secondary | ICD-10-CM | POA: Diagnosis not present

## 2020-07-27 DIAGNOSIS — J918 Pleural effusion in other conditions classified elsewhere: Secondary | ICD-10-CM | POA: Diagnosis present

## 2020-07-27 DIAGNOSIS — Z8701 Personal history of pneumonia (recurrent): Secondary | ICD-10-CM

## 2020-07-27 DIAGNOSIS — J189 Pneumonia, unspecified organism: Secondary | ICD-10-CM

## 2020-07-27 DIAGNOSIS — A408 Other streptococcal sepsis: Principal | ICD-10-CM | POA: Diagnosis present

## 2020-07-27 DIAGNOSIS — E669 Obesity, unspecified: Secondary | ICD-10-CM | POA: Diagnosis present

## 2020-07-27 DIAGNOSIS — D649 Anemia, unspecified: Secondary | ICD-10-CM | POA: Diagnosis not present

## 2020-07-27 DIAGNOSIS — Z20822 Contact with and (suspected) exposure to covid-19: Secondary | ICD-10-CM | POA: Diagnosis present

## 2020-07-27 DIAGNOSIS — Z4682 Encounter for fitting and adjustment of non-vascular catheter: Secondary | ICD-10-CM

## 2020-07-27 DIAGNOSIS — Z87410 Personal history of cervical dysplasia: Secondary | ICD-10-CM | POA: Diagnosis not present

## 2020-07-27 DIAGNOSIS — Z8249 Family history of ischemic heart disease and other diseases of the circulatory system: Secondary | ICD-10-CM

## 2020-07-27 DIAGNOSIS — R7303 Prediabetes: Secondary | ICD-10-CM | POA: Diagnosis not present

## 2020-07-27 LAB — COMPREHENSIVE METABOLIC PANEL
ALT: 9 U/L (ref 0–44)
AST: 9 U/L — ABNORMAL LOW (ref 15–41)
Albumin: 1.6 g/dL — ABNORMAL LOW (ref 3.5–5.0)
Alkaline Phosphatase: 110 U/L (ref 38–126)
Anion gap: 16 — ABNORMAL HIGH (ref 5–15)
BUN: 15 mg/dL (ref 6–20)
CO2: 21 mmol/L — ABNORMAL LOW (ref 22–32)
Calcium: 8.8 mg/dL — ABNORMAL LOW (ref 8.9–10.3)
Chloride: 102 mmol/L (ref 98–111)
Creatinine, Ser: 2.82 mg/dL — ABNORMAL HIGH (ref 0.44–1.00)
GFR, Estimated: 22 mL/min — ABNORMAL LOW (ref >60)
Glucose, Bld: 102 mg/dL — ABNORMAL HIGH (ref 70–99)
Potassium: 3.3 mmol/L — ABNORMAL LOW (ref 3.5–5.1)
Sodium: 139 mmol/L (ref 135–145)
Total Bilirubin: 0.6 mg/dL (ref 0.3–1.2)
Total Protein: 8 g/dL (ref 6.5–8.1)

## 2020-07-27 LAB — BLOOD GAS, ARTERIAL
Acid-base deficit: 1.4 mmol/L (ref 0.0–2.0)
Bicarbonate: 22 mmol/L (ref 20.0–28.0)
FIO2: 21
O2 Saturation: 97.1 %
Patient temperature: 37.6
pCO2 arterial: 32.7 mmHg (ref 32.0–48.0)
pH, Arterial: 7.446 (ref 7.350–7.450)
pO2, Arterial: 88.3 mmHg (ref 83.0–108.0)

## 2020-07-27 LAB — URINALYSIS, ROUTINE W REFLEX MICROSCOPIC
Bilirubin Urine: NEGATIVE
Glucose, UA: NEGATIVE mg/dL
Ketones, ur: NEGATIVE mg/dL
Nitrite: NEGATIVE
Protein, ur: >=300 mg/dL — AB
RBC / HPF: >50 RBC/hpf — ABNORMAL HIGH (ref 0–5)
Specific Gravity, Urine: 1.018 (ref 1.005–1.030)
pH: 6 (ref 5.0–8.0)

## 2020-07-27 LAB — CBC
HCT: 22.2 % — ABNORMAL LOW (ref 36.0–46.0)
Hemoglobin: 6.6 g/dL — CL (ref 12.0–15.0)
MCH: 26.3 pg (ref 26.0–34.0)
MCHC: 29.7 g/dL — ABNORMAL LOW (ref 30.0–36.0)
MCV: 88.4 fL (ref 80.0–100.0)
Platelets: 438 10*3/uL — ABNORMAL HIGH (ref 150–400)
RBC: 2.51 MIL/uL — ABNORMAL LOW (ref 3.87–5.11)
RDW: 14.1 % (ref 11.5–15.5)
WBC: 14.1 10*3/uL — ABNORMAL HIGH (ref 4.0–10.5)
nRBC: 0 % (ref 0.0–0.2)

## 2020-07-27 LAB — PROTIME-INR
INR: 1.4 — ABNORMAL HIGH (ref 0.8–1.2)
Prothrombin Time: 16.7 seconds — ABNORMAL HIGH (ref 11.4–15.2)

## 2020-07-27 LAB — RESPIRATORY PANEL BY RT PCR (FLU A&B, COVID)
Influenza A by PCR: NEGATIVE
Influenza B by PCR: NEGATIVE
SARS Coronavirus 2 by RT PCR: NEGATIVE

## 2020-07-27 LAB — PREPARE RBC (CROSSMATCH)

## 2020-07-27 LAB — ABO/RH: ABO/RH(D): B POS

## 2020-07-27 LAB — MRSA PCR SCREENING: MRSA by PCR: NEGATIVE

## 2020-07-27 MED ORDER — TRAMADOL HCL 50 MG PO TABS: 50.0000 mg | ORAL_TABLET | Freq: Two times a day (BID) | ORAL | Status: DC | PRN

## 2020-07-27 MED ORDER — ACETAMINOPHEN 500 MG PO TABS
1000.0000 mg | ORAL_TABLET | Freq: Four times a day (QID) | ORAL | Status: DC | PRN
  Administered 2020-07-27: 1000 mg via ORAL
  Filled 2020-07-27: qty 2

## 2020-07-27 MED ORDER — SODIUM CHLORIDE 0.9% IV SOLUTION: Freq: Once | INTRAVENOUS | Status: DC

## 2020-07-27 MED ORDER — SODIUM CHLORIDE 0.9 % IV SOLN
1.0000 g | INTRAVENOUS | Status: DC
  Administered 2020-07-27: 1 g via INTRAVENOUS
  Filled 2020-07-27: qty 10

## 2020-07-27 MED ORDER — DOCUSATE SODIUM 100 MG PO CAPS: 200.0000 mg | ORAL_CAPSULE | Freq: Every day | ORAL | Status: DC | PRN

## 2020-07-27 MED ORDER — ONDANSETRON HCL 4 MG/2ML IJ SOLN: 4.0000 mg | Freq: Four times a day (QID) | INTRAMUSCULAR | Status: DC | PRN

## 2020-07-27 MED ORDER — FUROSEMIDE 10 MG/ML IJ SOLN
20.0000 mg | Freq: Once | INTRAMUSCULAR | Status: DC
  Filled 2020-07-27: qty 2

## 2020-07-27 MED ORDER — VANCOMYCIN HCL IN DEXTROSE 1-5 GM/200ML-% IV SOLN
1000.0000 mg | INTRAVENOUS | Status: AC
  Administered 2020-07-28: 1000 mg via INTRAVENOUS
  Filled 2020-07-27: qty 200

## 2020-07-27 MED ORDER — MUPIROCIN 2 % EX OINT
1.0000 "application " | TOPICAL_OINTMENT | Freq: Two times a day (BID) | CUTANEOUS | Status: DC
  Administered 2020-07-27: 1 via NASAL
  Filled 2020-07-27: qty 22

## 2020-07-27 MED ORDER — POTASSIUM CHLORIDE CRYS ER 20 MEQ PO TBCR
30.0000 meq | EXTENDED_RELEASE_TABLET | Freq: Once | ORAL | Status: AC
  Administered 2020-07-27: 30 meq via ORAL
  Filled 2020-07-27: qty 1

## 2020-07-27 MED ORDER — SODIUM CHLORIDE 0.9 % IV SOLN: INTRAVENOUS | Status: DC

## 2020-07-27 NOTE — Progress Notes (Signed)
   07/27/20 1910  Assess: MEWS Score  Temp 100.1 F (37.8 C)  BP (!) 147/82  Pulse Rate (!) 116  ECG Heart Rate (!) 116  Resp (!) 30  Level of Consciousness Alert  SpO2 95 %  Assess: MEWS Score  MEWS Temp 0  MEWS Systolic 0  MEWS Pulse 2  MEWS RR 2  MEWS LOC 0  MEWS Score 4  MEWS Score Color Red  Assess: if the MEWS score is Yellow or Red  Were vital signs taken at a resting state? Yes  Focused Assessment No change from prior assessment  Early Detection of Sepsis Score *See Row Information* Medium  MEWS guidelines implemented *See Row Information* Yes  Treat  Pain Scale 0-10  Pain Score 0  Take Vital Signs  Increase Vital Sign Frequency  Red: Q 1hr X 4 then Q 4hr X 4, if remains red, continue Q 4hrs  Escalate  MEWS: Escalate Red: discuss with charge nurse/RN and provider, consider discussing with RRT  Notify: Charge Nurse/RN  Name of Charge Nurse/RN Notified Karlene, RN  Date Charge Nurse/RN Notified 07/27/20  Time Charge Nurse/RN Notified 1910  Notify: Provider  Provider Name/Title Dr. Kipp Brood  Date Provider Notified 07/27/20  Time Provider Notified [phone]  Notification Type Call  Notification Reason Other (Comment) (increased temp post blood transfusion)  Response No new orders  Date of Provider Response 07/27/20  Time of Provider Response 1930  Document  Patient Outcome Other (Comment) (on unit)  Progress note created (see row info) Yes

## 2020-07-27 NOTE — Progress Notes (Signed)
PCP is Samuel Bouche, NP Referring Provider is Samuel Bouche, NP  Chief Complaint  Patient presents with  . Pleural Effusion    s/p thoracentesis 07/01/20, cxr today    HPI: Ms. Tripathi is sent for consultation regarding a left pleural effusion.  Yolande Skoda is a 34 year old woman previously healthy who recently was hospitalized at [facility] for left lower lobe pneumonia with a loculated left pleural effusion.  Course was complicated by anemia, sepsis, and acute kidney injury.  She was treated with intravenous antibiotics.  She had a thoracentesis at 1 point during her hospitalization.  Apparently they tried several times but the one time they were able to access the chest they only got about 35 cc of fluid out.  She had a CT scan which I reviewed which showed a complex loculated left pleural effusion with near complete left lower lobe atelectasis.  She was discharged on 07/05/2020.  Since then she continues to have fullness or pressure in the left side of her chest.  She has a nonproductive cough with pleuritic chest pain.  She is not having fevers or chills but she does have continued general malaise and loss of appetite.  She says that she has not been able to hold down any food or liquids for the past 48 hours.  She is unable to sleep due to difficulties getting comfortable.   Past Medical History:  Diagnosis Date  . Cervical dysplasia   . Hx of migraines     Past Surgical History:  Procedure Laterality Date  . CERVICAL ABLATION  2009  . LEEP    . 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 Hypertension Mother   . Migraines Mother   . Migraines Sister   . Hypertension Brother   . Migraines Brother   . Hypertension Maternal Grandmother     Social History Social History   Tobacco Use  . Smoking  status: Never Smoker  . Smokeless tobacco: Never Used  Vaping Use  . Vaping Use: Never used  Substance Use Topics  . Alcohol use: No  . Drug use: No    Current Outpatient Medications  Medication Sig Dispense Refill  . aspirin-acetaminophen-caffeine (EXCEDRIN MIGRAINE) 250-250-65 MG tablet Take by mouth every 6 (six) hours as needed for headache.     No current facility-administered medications for this visit.    Allergies  Allergen Reactions  . Shellfish-Derived Products Other (See Comments)    No reaction listed    Review of Systems  Constitutional: Positive for activity change, appetite change and fatigue.  HENT: Negative for trouble swallowing and voice change.   Respiratory: Positive for shortness of breath.        Fullness, pressure left chest  Cardiovascular: Negative for palpitations.  Gastrointestinal: Positive for nausea and vomiting.  Genitourinary: Negative for difficulty urinating and dysuria.  Musculoskeletal: Positive for myalgias.  Neurological: Negative for syncope and weakness.  All other systems reviewed and are negative.   BP 117/70 (BP Location: Right Arm, Patient Position: Sitting)   Pulse (!) 148   Temp 98.2 F (36.8 C)   Resp 20   Ht 5\' 4"  (1.626 m)   Wt 220 lb (99.8 kg)   LMP 06/29/2020   SpO2 95% Comment: RA with mask on  BMI 37.76 kg/m  Physical Exam Vitals reviewed.  Constitutional:      Appearance: She is obese. She is ill-appearing.  HENT:     Head: Normocephalic and atraumatic.  Eyes:     General: No scleral icterus. Cardiovascular:     Rate and Rhythm: Regular rhythm. Tachycardia present.     Heart sounds: No murmur heard.  No friction rub. No gallop.   Pulmonary:     Effort: No respiratory distress.     Breath sounds: No wheezing or rales.     Comments: Absent BS left lower 2/3 Abdominal:     General: There is no distension.     Palpations: Abdomen is soft.     Tenderness: There is no abdominal tenderness.   Musculoskeletal:     Cervical back: Neck supple.  Lymphadenopathy:     Cervical: No cervical adenopathy.  Skin:    General: Skin is dry.  Neurological:     General: No focal deficit present.     Mental Status: She is oriented to person, place, and time.     Cranial Nerves: No cranial nerve deficit.     Motor: No weakness.    Diagnostic Tests: CHEST - 2 VIEW  COMPARISON:  07/12/2020  FINDINGS: Area of continued dense consolidation in the left mid and lower lung. There is now an area of cavitation superiorly with gas noted. Possible small adjacent loculated left effusion. Right lung clear. Heart is normal size.  IMPRESSION: Continued dense area of consolidation in the left mid and lower lung now with small area of cavitation. This could reflect lung abscess. Adjacent small left pleural effusion. Given the persistence over time, this could be further evaluated with chest CT with IV contrast.   Electronically Signed   By: Rolm Baptise M.D.   On: 07/27/2020 10:31 I personally reviewed the chest x-ray images and concur with the findings noted above  Impression: Lindsay Hall is a 34 year old obese woman with recent pneumonia complicated by left empyema.  She was treated at [facility] with about 5 days of IV antibiotics and completed a course of IV antibiotics at home.  She improved initially after discharge but never returned to her baseline.  She is continued to have problems with cough with pleuritic chest pain, pressure in the left side of her chest, poor appetite, general malaise, and shortness of breath with even light exertion.  Over the past 48 hours she has had nausea and vomiting and has been unable to hold down any food or water.  She appears acutely ill.  She is dehydrated and very tachycardic.  She needs hospital admission for hydration with IV fluids and intravenous antibiotics.  We will arrange for that admission today.  I recommended to her that we do  a left VATS to drain the empyema and decorticate the left lower lobe.  I described the procedure to her including the need for general anesthesia, the incisions to be used, the use of drainage tubes postoperatively, the expected hospital stay, and the overall recovery.  I informed her of the indications, risks, benefits, and alternatives.  She understands the risks include, but are not limited to death, MI, DVT, PE, bleeding, need for transfusion, infection, prolonged air leak, cardiac arrhythmias, renal failure, as well as possibility of other unforeseeable complications.  She understands accepts the risks and agrees to proceed.  Acute kidney injury-recent episode while hospitalized with pneumonia at [facility].  Her most recent creatinine was 1.88 on 07/12/2020.  She is dehydrated and tachycardic on exam.  We will check  her renal function.  Plan: Admit Hydrate IV antibiotics CT chest, no contrast due to recent AKI, elevated creatinine at 1.88, current dehydration Left VATS for drainage of empyema and decortication on 07/28/2020  Melrose Nakayama, MD [facility] [phone]

## 2020-07-27 NOTE — H&P (Addendum)
[address]            [place] [address]          [phone]     Lindsay Hall is an 34 y.o. female. 1986/08/17 [id number]   Chief Complaint: Pneumonia, left empyema  History of Presenting Illness:  This is a 34 year old woman previously healthy who recently was hospitalized at [facility] for left lower lobe pneumonia with a loculated left pleural effusion.  Course was complicated by anemia, sepsis, and acute kidney injury.  She was treated with intravenous antibiotics.  She had a thoracentesis at 1 point during her hospitalization.  Apparently they tried several times but the one time they were able to access the chest they only got about 35 cc of fluid out.  She had a CT scan which I reviewed which showed a complex loculated left pleural effusion with near complete left lower lobe atelectasis.  She was discharged on 07/05/2020.  Since then she continues to have fullness or pressure in the left side of her chest.  She has a nonproductive cough with pleuritic chest pain.  She is not having fevers or chills but she does have continued general malaise and loss of appetite.  She says that she has not been able to hold down any food or liquids for the past 48 hours.  She is unable to sleep due to difficulties getting comfortable. 34 year old obese woman with recent pneumonia complicated by left empyema.  She was treated at [facility] with about 5 days of IV antibiotics and completed a course of IV antibiotics at home.  She improved initially after discharge but never returned to her baseline.  She is continued to have problems with cough with pleuritic chest pain, pressure in the left side of her chest, poor appetite, general malaise, and shortness of breath with even light exertion.  Over the past 48 hours she has had nausea and vomiting and has been unable to hold down any food or water.  She appears acutely ill.  She is dehydrated and very  tachycardic.  She needs hospital admission for hydration with IV fluids and intravenous antibiotics.  Arrangements have been made for direct admission to [facility].  Past Medical History: Past Medical History:  Diagnosis Date  . Cervical dysplasia   . Hx of migraines     Past Surgical History: Past Surgical History:  Procedure Laterality Date  . CERVICAL ABLATION  2009  . LEEP    . 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 Hypertension Mother   . Migraines Mother   . Migraines Sister   . Hypertension Brother   . Migraines Brother   . Hypertension Maternal Grandmother      Social History:   reports that she has never smoked. She has never used smokeless tobacco. She reports that she does not drink alcohol and does not use drugs.  Allergies:  Allergies  Allergen Reactions  . Shellfish-Derived Products Other (See Comments)    No reaction listed    Review of Systems:  Cardiac Review of Systems: Y or N  Chest Pain [pleuritic left sided chest pain ] Exertional SOB [ Y ] Pedal Edema Aqua.Slicker ]  General Review of Systems: [Y] = yes [N ]=no  Constitional:  fatigue [ Y]; nausea [ Y];  fever Aqua.Slicker ]; or chills Aqua.Slicker ];  Eye :  Amaurosis fugax[N ];  Resp: cough [ Y-non productive]; wheezing[N]; hemoptysis[N ];  GI: vomiting[Y ];  melena[ N]; hematochezia [N];  GU: hematuria[ N];  Heme/Lymph: bleeding[N ];  Musculoskeletal: Myalgias [Y] Neuro: TIA[ N]; headaches[ history of migraines]; stroke[N ]; vertigo[N ]; seizures[N ];  Endocrine: diabetes[N ]; thyroid dysfunction[N ];  She has not had flu vaccine or COVID vaccine  LMP 06/29/2020   Physical Exam: General appearance: alert, appears ill, and moderately obese Neurologic: intact Heart: Tachycardic, no murmur Lungs: Diminshed left  basilar breath sounds;right lung clear Abdomen: Soft, non tender, bowel sounds present Extremities: Trace LE edema. Palpable pulses bilaterally   Diagnostic Studies and Laboratory Results: No results found for this or any previous visit (from the past 48 hour(s)). DG Chest 2 View  Result Date: 07/27/2020 CLINICAL DATA:  Follow-up pneumonia, shortness of breath, cough EXAM: CHEST - 2 VIEW COMPARISON:  07/12/2020 FINDINGS: Area of continued dense consolidation in the left mid and lower lung. There is now an area of cavitation superiorly with gas noted. Possible small adjacent loculated left effusion. Right lung clear. Heart is normal size. IMPRESSION: Continued dense area of consolidation in the left mid and lower lung now with small area of cavitation. This could reflect lung abscess. Adjacent small left pleural effusion. Given the persistence over time, this could be further evaluated with chest CT with IV contrast. Electronically Signed   By: Rolm Baptise M.D.   On: 07/27/2020 10:31     Assessment/Plan 1. Recent pneumonia with left empyema-Rocephin to be started. Will obtain CT of the chest (without contrast). She will ultimately need a left vats, drain empyema, and decortication. 2. History of AKI-creatinine while at [facility] on 11/08 was 1.88. Obtain CMP and CBC 3. Sepsis/dehydration-IVF  Nani Skillern, PA-C 07/27/2020, 11:19 AM  Lindsay Hall feels better since admission and IV hydration underway. Will get Ct chest but no contrast Creatinine 2.82 c/w acute renal failure due to acute kidney injury, hopefully will improve with hydration Supplement K for 3.3- caution with elevated creatinine Acute on chronic anemia - Hgb only 6.6 and will undergo surgery in AM- will transfuse 2 units Discussed indications, risks, benefits and alternatives with her again this afternoon.  She agrees to proceed with left VATS in AM  Yitzhak Awan C. Roxan Hockey, MD [facility] [phone]

## 2020-07-27 NOTE — Anesthesia Preprocedure Evaluation (Addendum)
Anesthesia Evaluation  Patient identified by MRN, date of birth, ID band Patient awake    Reviewed: Allergy & Precautions, NPO status , Patient's Chart, lab work & pertinent test results  History of Anesthesia Complications Negative for: history of anesthetic complications  Airway Mallampati: III  TM Distance: >3 FB Neck ROM: Full    Dental  (+) Dental Advisory Given   Pulmonary pneumonia,   Left empyema    + rhonchi        Cardiovascular hypertension,  Rhythm:Regular Rate:Tachycardia   '21 TTE - EF 50 to 55%. Mild concentric left ventricular  hypertrophy.     Neuro/Psych  Headaches, negative psych ROS   GI/Hepatic negative GI ROS, Neg liver ROS,   Endo/Other   Obesity   Renal/GU Renal Insufficiency and ARFRenal disease     Musculoskeletal negative musculoskeletal ROS (+)   Abdominal   Peds  Hematology  (+) anemia ,  INR 1.4    Anesthesia Other Findings Covid test negative   Reproductive/Obstetrics                            Anesthesia Physical Anesthesia Plan  ASA: III  Anesthesia Plan: General   Post-op Pain Management:    Induction: Intravenous  PONV Risk Score and Plan: 3 and Treatment may vary due to age or medical condition, Ondansetron, Dexamethasone, Midazolam and Scopolamine patch - Pre-op  Airway Management Planned: Double Lumen EBT  Additional Equipment: Arterial line, CVP and Ultrasound Guidance Line Placement  Intra-op Plan:   Post-operative Plan: Possible Post-op intubation/ventilation  Informed Consent: I have reviewed the patients History and Physical, chart, labs and discussed the procedure including the risks, benefits and alternatives for the proposed anesthesia with the patient or authorized representative who has indicated his/her understanding and acceptance.     Dental advisory given  Plan Discussed with: CRNA and  Anesthesiologist  Anesthesia Plan Comments:        Anesthesia Quick Evaluation

## 2020-07-27 NOTE — Progress Notes (Signed)
Patient's oral temp 100.1 F post 1 unit of blood transfusion. Patient's HR 116 and RR 30. MD made aware of the Red MEWS and the increase in temperature. MD okay to transfuse another unit of blood with increased temperature considering the patient's empyema and possible infection.

## 2020-07-28 ENCOUNTER — Inpatient Hospital Stay (HOSPITAL_COMMUNITY): Payer: Medicaid Other | Admitting: Certified Registered Nurse Anesthetist

## 2020-07-28 ENCOUNTER — Encounter (HOSPITAL_COMMUNITY)
Admission: AD | Disposition: A | Discharge status: Home / Self Care | Payer: Self-pay | Source: Ambulatory Visit | Attending: Thoracic Surgery (Cardiothoracic Vascular Surgery)

## 2020-07-28 ENCOUNTER — Inpatient Hospital Stay (HOSPITAL_COMMUNITY): Payer: Medicaid Other

## 2020-07-28 ENCOUNTER — Inpatient Hospital Stay (HOSPITAL_COMMUNITY)
Admission: RE | Admit: 2020-07-28 | Payer: Self-pay | Source: Home / Self Care | Admitting: Thoracic Surgery (Cardiothoracic Vascular Surgery)

## 2020-07-28 DIAGNOSIS — J90 Pleural effusion, not elsewhere classified: Secondary | ICD-10-CM

## 2020-07-28 HISTORY — PX: DECORTICATION: SHX5101

## 2020-07-28 HISTORY — PX: VIDEO ASSISTED THORACOSCOPY (VATS)/EMPYEMA: SHX6172

## 2020-07-28 LAB — BASIC METABOLIC PANEL
Anion gap: 12 (ref 5–15)
BUN: 15 mg/dL (ref 6–20)
CO2: 21 mmol/L — ABNORMAL LOW (ref 22–32)
Calcium: 8.3 mg/dL — ABNORMAL LOW (ref 8.9–10.3)
Chloride: 103 mmol/L (ref 98–111)
Creatinine, Ser: 2.58 mg/dL — ABNORMAL HIGH (ref 0.44–1.00)
GFR, Estimated: 24 mL/min — ABNORMAL LOW (ref >60)
Glucose, Bld: 115 mg/dL — ABNORMAL HIGH (ref 70–99)
Potassium: 3.7 mmol/L (ref 3.5–5.1)
Sodium: 136 mmol/L (ref 135–145)

## 2020-07-28 LAB — POCT I-STAT 7, (LYTES, BLD GAS, ICA,H+H)
Acid-base deficit: 3 mmol/L — ABNORMAL HIGH (ref 0.0–2.0)
Acid-base deficit: 4 mmol/L — ABNORMAL HIGH (ref 0.0–2.0)
Bicarbonate: 21.2 mmol/L (ref 20.0–28.0)
Bicarbonate: 20.1 mmol/L (ref 20.0–28.0)
Calcium, Ion: 1.15 mmol/L (ref 1.15–1.40)
Calcium, Ion: 1.13 mmol/L — ABNORMAL LOW (ref 1.15–1.40)
HCT: 24 % — ABNORMAL LOW (ref 36.0–46.0)
HCT: 22 % — ABNORMAL LOW (ref 36.0–46.0)
Hemoglobin: 8.2 g/dL — ABNORMAL LOW (ref 12.0–15.0)
Hemoglobin: 7.5 g/dL — ABNORMAL LOW (ref 12.0–15.0)
O2 Saturation: 95 %
O2 Saturation: 95 %
Potassium: 3.8 mmol/L (ref 3.5–5.1)
Potassium: 3.8 mmol/L (ref 3.5–5.1)
Sodium: 139 mmol/L (ref 135–145)
Sodium: 139 mmol/L (ref 135–145)
TCO2: 22 mmol/L (ref 22–32)
TCO2: 21 mmol/L — ABNORMAL LOW (ref 22–32)
pCO2 arterial: 33.1 mmHg (ref 32.0–48.0)
pCO2 arterial: 30.8 mmHg — ABNORMAL LOW (ref 32.0–48.0)
pH, Arterial: 7.415 (ref 7.350–7.450)
pH, Arterial: 7.422 (ref 7.350–7.450)
pO2, Arterial: 72 mmHg — ABNORMAL LOW (ref 83.0–108.0)
pO2, Arterial: 73 mmHg — ABNORMAL LOW (ref 83.0–108.0)

## 2020-07-28 LAB — HEMOGLOBIN AND HEMATOCRIT, BLOOD
HCT: 27.3 % — ABNORMAL LOW (ref 36.0–46.0)
Hemoglobin: 8.4 g/dL — ABNORMAL LOW (ref 12.0–15.0)

## 2020-07-28 LAB — CBC
HCT: 24.8 % — ABNORMAL LOW (ref 36.0–46.0)
Hemoglobin: 7.8 g/dL — ABNORMAL LOW (ref 12.0–15.0)
MCH: 28 pg (ref 26.0–34.0)
MCHC: 31.5 g/dL (ref 30.0–36.0)
MCV: 88.9 fL (ref 80.0–100.0)
Platelets: 421 10*3/uL — ABNORMAL HIGH (ref 150–400)
RBC: 2.79 MIL/uL — ABNORMAL LOW (ref 3.87–5.11)
RDW: 14.4 % (ref 11.5–15.5)
WBC: 11.1 10*3/uL — ABNORMAL HIGH (ref 4.0–10.5)
nRBC: 0 % (ref 0.0–0.2)

## 2020-07-28 LAB — PREPARE RBC (CROSSMATCH)

## 2020-07-28 LAB — SURGICAL PCR SCREEN
MRSA, PCR: NEGATIVE
Staphylococcus aureus: NEGATIVE

## 2020-07-28 LAB — HCG, SERUM, QUALITATIVE: Preg, Serum: NEGATIVE

## 2020-07-28 LAB — APTT: aPTT: 28 seconds (ref 24–36)

## 2020-07-28 SURGERY — VIDEO ASSISTED THORACOSCOPY (VATS)/EMPYEMA
Anesthesia: General | Site: Chest | Laterality: Left

## 2020-07-28 MED ORDER — ACETAMINOPHEN 160 MG/5ML PO SOLN
1000.0000 mg | Freq: Four times a day (QID) | ORAL | Status: AC
  Administered 2020-07-28 – 2020-07-29 (×2): 1000 mg via ORAL
  Filled 2020-07-28 (×2): qty 40.6

## 2020-07-28 MED ORDER — BISACODYL 5 MG PO TBEC
10.0000 mg | DELAYED_RELEASE_TABLET | Freq: Every day | ORAL | Status: DC
  Administered 2020-07-29 – 2020-07-31 (×3): 10 mg via ORAL
  Filled 2020-07-28 (×5): qty 2

## 2020-07-28 MED ORDER — ROCURONIUM BROMIDE 10 MG/ML (PF) SYRINGE
PREFILLED_SYRINGE | INTRAVENOUS | Status: AC
  Filled 2020-07-28: qty 10

## 2020-07-28 MED ORDER — LABETALOL HCL 5 MG/ML IV SOLN: 10.0000 mg | INTRAVENOUS | Status: DC | PRN

## 2020-07-28 MED ORDER — ONDANSETRON HCL 4 MG/2ML IJ SOLN
4.0000 mg | Freq: Four times a day (QID) | INTRAMUSCULAR | Status: DC | PRN
  Administered 2020-07-30 (×2): 4 mg via INTRAVENOUS
  Filled 2020-07-28 (×2): qty 2

## 2020-07-28 MED ORDER — LIDOCAINE 2% (20 MG/ML) 5 ML SYRINGE
INTRAMUSCULAR | Status: DC | PRN
  Administered 2020-07-28: 40 mg via INTRAVENOUS

## 2020-07-28 MED ORDER — DIPHENHYDRAMINE HCL 50 MG/ML IJ SOLN: 12.5000 mg | Freq: Four times a day (QID) | INTRAMUSCULAR | Status: DC | PRN

## 2020-07-28 MED ORDER — VANCOMYCIN HCL 1250 MG/250ML IV SOLN
1250.0000 mg | INTRAVENOUS | Status: DC
  Administered 2020-07-28 – 2020-07-30 (×3): 1250 mg via INTRAVENOUS
  Filled 2020-07-28 (×4): qty 250

## 2020-07-28 MED ORDER — PROMETHAZINE HCL 25 MG/ML IJ SOLN: 6.2500 mg | INTRAMUSCULAR | Status: DC | PRN

## 2020-07-28 MED ORDER — DEXAMETHASONE SODIUM PHOSPHATE 10 MG/ML IJ SOLN
INTRAMUSCULAR | Status: AC
  Filled 2020-07-28: qty 1

## 2020-07-28 MED ORDER — CHLORHEXIDINE GLUCONATE CLOTH 2 % EX PADS
6.0000 | MEDICATED_PAD | Freq: Every day | CUTANEOUS | Status: DC
  Administered 2020-07-28 – 2020-07-31 (×4): 6 via TOPICAL

## 2020-07-28 MED ORDER — ALBUMIN HUMAN 5 % IV SOLN: INTRAVENOUS | Status: DC | PRN

## 2020-07-28 MED ORDER — ONDANSETRON HCL 4 MG/2ML IJ SOLN
INTRAMUSCULAR | Status: AC
  Filled 2020-07-28: qty 2

## 2020-07-28 MED ORDER — ONDANSETRON HCL 4 MG/2ML IJ SOLN
INTRAMUSCULAR | Status: DC | PRN
  Administered 2020-07-28: 4 mg via INTRAVENOUS

## 2020-07-28 MED ORDER — ESMOLOL HCL 100 MG/10ML IV SOLN
INTRAVENOUS | Status: DC | PRN
  Administered 2020-07-28 (×4): 10 mg via INTRAVENOUS

## 2020-07-28 MED ORDER — ENOXAPARIN SODIUM 40 MG/0.4ML ~~LOC~~ SOLN
40.0000 mg | Freq: Every day | SUBCUTANEOUS | Status: DC
  Administered 2020-07-29 – 2020-08-02 (×5): 40 mg via SUBCUTANEOUS
  Filled 2020-07-28 (×6): qty 0.4

## 2020-07-28 MED ORDER — 0.9 % SODIUM CHLORIDE (POUR BTL) OPTIME
TOPICAL | Status: DC | PRN
  Administered 2020-07-28: 2000 mL

## 2020-07-28 MED ORDER — OXYCODONE HCL 5 MG/5ML PO SOLN: 5.0000 mg | Freq: Once | ORAL | Status: DC | PRN

## 2020-07-28 MED ORDER — SUGAMMADEX SODIUM 200 MG/2ML IV SOLN
INTRAVENOUS | Status: DC | PRN
  Administered 2020-07-28 (×2): 200 mg via INTRAVENOUS

## 2020-07-28 MED ORDER — ACETAMINOPHEN 500 MG PO TABS
1000.0000 mg | ORAL_TABLET | Freq: Four times a day (QID) | ORAL | Status: AC
  Administered 2020-07-28 – 2020-08-02 (×15): 1000 mg via ORAL
  Filled 2020-07-28 (×13): qty 2

## 2020-07-28 MED ORDER — PROPOFOL 10 MG/ML IV BOLUS
INTRAVENOUS | Status: DC | PRN
  Administered 2020-07-28: 30 mg via INTRAVENOUS
  Administered 2020-07-28: 200 mg via INTRAVENOUS

## 2020-07-28 MED ORDER — ROCURONIUM BROMIDE 10 MG/ML (PF) SYRINGE
PREFILLED_SYRINGE | INTRAVENOUS | Status: DC | PRN
  Administered 2020-07-28: 20 mg via INTRAVENOUS
  Administered 2020-07-28: 60 mg via INTRAVENOUS
  Administered 2020-07-28: 40 mg via INTRAVENOUS

## 2020-07-28 MED ORDER — FENTANYL CITRATE (PF) 250 MCG/5ML IJ SOLN
INTRAMUSCULAR | Status: DC | PRN
  Administered 2020-07-28: 50 ug via INTRAVENOUS
  Administered 2020-07-28: 25 ug via INTRAVENOUS
  Administered 2020-07-28: 50 ug via INTRAVENOUS
  Administered 2020-07-28: 100 ug via INTRAVENOUS
  Administered 2020-07-28: 25 ug via INTRAVENOUS
  Administered 2020-07-28: 50 ug via INTRAVENOUS
  Administered 2020-07-28: 25 ug via INTRAVENOUS
  Administered 2020-07-28: 100 ug via INTRAVENOUS
  Administered 2020-07-28: 50 ug via INTRAVENOUS

## 2020-07-28 MED ORDER — OXYCODONE HCL 5 MG PO TABS
5.0000 mg | ORAL_TABLET | ORAL | Status: DC | PRN
  Administered 2020-07-28 – 2020-07-29 (×2): 10 mg via ORAL
  Administered 2020-07-31: 5 mg via ORAL
  Administered 2020-08-02: 10 mg via ORAL
  Filled 2020-07-28 (×3): qty 2
  Filled 2020-07-28: qty 1

## 2020-07-28 MED ORDER — PROPOFOL 10 MG/ML IV BOLUS
INTRAVENOUS | Status: AC
  Filled 2020-07-28: qty 20

## 2020-07-28 MED ORDER — DEXAMETHASONE SODIUM PHOSPHATE 10 MG/ML IJ SOLN
INTRAMUSCULAR | Status: DC | PRN
  Administered 2020-07-28: 5 mg via INTRAVENOUS

## 2020-07-28 MED ORDER — OXYCODONE HCL 5 MG PO TABS: 5.0000 mg | ORAL_TABLET | Freq: Once | ORAL | Status: DC | PRN

## 2020-07-28 MED ORDER — FENTANYL CITRATE (PF) 100 MCG/2ML IJ SOLN
INTRAMUSCULAR | Status: AC
  Administered 2020-07-28: 50 ug via INTRAVENOUS
  Filled 2020-07-28: qty 2

## 2020-07-28 MED ORDER — SODIUM CHLORIDE 0.9% FLUSH: 9.0000 mL | INTRAVENOUS | Status: DC | PRN

## 2020-07-28 MED ORDER — PHENYLEPHRINE 40 MCG/ML (10ML) SYRINGE FOR IV PUSH (FOR BLOOD PRESSURE SUPPORT)
PREFILLED_SYRINGE | INTRAVENOUS | Status: AC
  Filled 2020-07-28: qty 10

## 2020-07-28 MED ORDER — FENTANYL CITRATE (PF) 250 MCG/5ML IJ SOLN
INTRAMUSCULAR | Status: AC
  Filled 2020-07-28: qty 5

## 2020-07-28 MED ORDER — TRAMADOL HCL 50 MG PO TABS
50.0000 mg | ORAL_TABLET | Freq: Four times a day (QID) | ORAL | Status: DC | PRN
  Administered 2020-07-28 – 2020-08-01 (×10): 100 mg via ORAL
  Filled 2020-07-28 (×10): qty 2

## 2020-07-28 MED ORDER — LACTATED RINGERS IV SOLN: INTRAVENOUS | Status: DC | PRN

## 2020-07-28 MED ORDER — LIDOCAINE HCL (PF) 2 % IJ SOLN
INTRAMUSCULAR | Status: AC
  Filled 2020-07-28: qty 5

## 2020-07-28 MED ORDER — PHENYLEPHRINE 40 MCG/ML (10ML) SYRINGE FOR IV PUSH (FOR BLOOD PRESSURE SUPPORT)
PREFILLED_SYRINGE | INTRAVENOUS | Status: DC | PRN
  Administered 2020-07-28: 80 ug via INTRAVENOUS

## 2020-07-28 MED ORDER — ACETAMINOPHEN 10 MG/ML IV SOLN
1000.0000 mg | Freq: Once | INTRAVENOUS | Status: AC
  Administered 2020-07-28: 1000 mg via INTRAVENOUS

## 2020-07-28 MED ORDER — SODIUM CHLORIDE 0.9 % IV SOLN: INTRAVENOUS | Status: DC

## 2020-07-28 MED ORDER — BUPIVACAINE LIPOSOME 1.3 % IJ SUSP
20.0000 mL | Freq: Once | INTRAMUSCULAR | Status: DC
  Filled 2020-07-28: qty 20

## 2020-07-28 MED ORDER — FENTANYL CITRATE (PF) 100 MCG/2ML IJ SOLN
25.0000 ug | INTRAMUSCULAR | Status: DC | PRN
  Administered 2020-07-28: 50 ug via INTRAVENOUS

## 2020-07-28 MED ORDER — SODIUM CHLORIDE FLUSH 0.9 % IV SOLN
INTRAVENOUS | Status: DC | PRN
  Administered 2020-07-28: 21 mL

## 2020-07-28 MED ORDER — MIDAZOLAM HCL 2 MG/2ML IJ SOLN
INTRAMUSCULAR | Status: DC | PRN
  Administered 2020-07-28: 2 mg via INTRAVENOUS

## 2020-07-28 MED ORDER — DIPHENHYDRAMINE HCL 12.5 MG/5ML PO ELIX: 12.5000 mg | ORAL_SOLUTION | Freq: Four times a day (QID) | ORAL | Status: DC | PRN

## 2020-07-28 MED ORDER — MIDAZOLAM HCL 2 MG/2ML IJ SOLN
INTRAMUSCULAR | Status: AC
  Filled 2020-07-28: qty 2

## 2020-07-28 MED ORDER — PHENYLEPHRINE HCL-NACL 10-0.9 MG/250ML-% IV SOLN
INTRAVENOUS | Status: DC | PRN
  Administered 2020-07-28 (×2): 20 ug/min via INTRAVENOUS

## 2020-07-28 MED ORDER — HYDROMORPHONE 1 MG/ML IV SOLN
INTRAVENOUS | Status: DC
  Administered 2020-07-28: 30 mg via INTRAVENOUS
  Administered 2020-07-29: 0.3 mg via INTRAVENOUS
  Administered 2020-07-29: 0.9 mg via INTRAVENOUS
  Administered 2020-07-29: 0.3 mg via INTRAVENOUS
  Administered 2020-07-30: 0 mg via INTRAVENOUS
  Administered 2020-07-30: 0.3 mg via INTRAVENOUS
  Administered 2020-07-30 (×2): 0.6 mg via INTRAVENOUS
  Filled 2020-07-28: qty 30

## 2020-07-28 MED ORDER — NALOXONE HCL 0.4 MG/ML IJ SOLN: 0.4000 mg | INTRAMUSCULAR | Status: DC | PRN

## 2020-07-28 MED ORDER — PIPERACILLIN-TAZOBACTAM 3.375 G IVPB
3.3750 g | Freq: Three times a day (TID) | INTRAVENOUS | Status: DC
  Administered 2020-07-28 – 2020-08-01 (×12): 3.375 g via INTRAVENOUS
  Filled 2020-07-28 (×13): qty 50

## 2020-07-28 MED ORDER — SENNOSIDES-DOCUSATE SODIUM 8.6-50 MG PO TABS
1.0000 | ORAL_TABLET | Freq: Every day | ORAL | Status: DC
  Administered 2020-07-28 – 2020-07-30 (×3): 1 via ORAL
  Filled 2020-07-28 (×5): qty 1

## 2020-07-28 SURGICAL SUPPLY — 62 items
CANISTER SUCT 3000ML PPV (MISCELLANEOUS) ×4 IMPLANT
CLEANER TIP ELECTROSURG 2X2 (MISCELLANEOUS) ×8 IMPLANT
CNTNR URN SCR LID CUP LEK RST (MISCELLANEOUS) ×8 IMPLANT
CONN ST 1/4X3/8  BEN (MISCELLANEOUS) ×6
CONN ST 1/4X3/8 BEN (MISCELLANEOUS) ×6 IMPLANT
CONN Y 3/8X3/8X3/8  BEN (MISCELLANEOUS) ×2
CONN Y 3/8X3/8X3/8 BEN (MISCELLANEOUS) ×2 IMPLANT
CONT SPEC 4OZ STRL OR WHT (MISCELLANEOUS) ×8
CUTTER ECHEON FLEX ENDO 45 340 (ENDOMECHANICALS) ×4 IMPLANT
DEFOGGER SCOPE WARMER CLEARIFY (MISCELLANEOUS) ×4 IMPLANT
DRAIN CHANNEL 28F RND 3/8 FF (WOUND CARE) ×8 IMPLANT
DRAIN CHANNEL 32F RND 10.7 FF (WOUND CARE) ×4 IMPLANT
DRAPE CV SPLIT W-CLR ANES SCRN (DRAPES) ×4 IMPLANT
DRAPE ORTHO SPLIT 77X108 STRL (DRAPES) ×2
DRAPE SURG ORHT 6 SPLT 77X108 (DRAPES) ×2 IMPLANT
ELECT BLADE 6.5 EXT (BLADE) ×4 IMPLANT
ELECT REM PT RETURN 9FT ADLT (ELECTROSURGICAL) ×4
ELECTRODE REM PT RTRN 9FT ADLT (ELECTROSURGICAL) ×2 IMPLANT
GAUZE SPONGE 4X4 12PLY STRL (GAUZE/BANDAGES/DRESSINGS) ×4 IMPLANT
GAUZE SPONGE 4X4 12PLY STRL LF (GAUZE/BANDAGES/DRESSINGS) ×4 IMPLANT
GLOVE BIO SURGEON STRL SZ 6.5 (GLOVE) ×12 IMPLANT
GLOVE BIO SURGEONS STRL SZ 6.5 (GLOVE) ×4
GLOVE BIOGEL PI IND STRL 8 (GLOVE) ×4 IMPLANT
GLOVE BIOGEL PI INDICATOR 8 (GLOVE) ×4
GLOVE SURG SIGNA 7.5 PF LTX (GLOVE) ×8 IMPLANT
GLOVE SURG SS PI 7.5 STRL IVOR (GLOVE) ×4 IMPLANT
GOWN STRL REUS W/ TWL LRG LVL3 (GOWN DISPOSABLE) ×8 IMPLANT
GOWN STRL REUS W/ TWL XL LVL3 (GOWN DISPOSABLE) ×2 IMPLANT
GOWN STRL REUS W/TWL LRG LVL3 (GOWN DISPOSABLE) ×8
GOWN STRL REUS W/TWL XL LVL3 (GOWN DISPOSABLE) ×2
KIT BASIN OR (CUSTOM PROCEDURE TRAY) ×4 IMPLANT
KIT SUCTION CATH 14FR (SUCTIONS) ×4 IMPLANT
KIT TURNOVER KIT B (KITS) ×4 IMPLANT
NEEDLE HYPO 25GX1X1/2 BEV (NEEDLE) ×4 IMPLANT
NEEDLE SPNL 22GX3.5 QUINCKE BK (NEEDLE) ×4 IMPLANT
NS IRRIG 1000ML POUR BTL (IV SOLUTION) ×12 IMPLANT
PACK CHEST (CUSTOM PROCEDURE TRAY) ×4 IMPLANT
PAD ARMBOARD 7.5X6 YLW CONV (MISCELLANEOUS) ×8 IMPLANT
SCISSORS ENDO CVD 5DCS (MISCELLANEOUS) ×4 IMPLANT
SOL ANTI FOG 6CC (MISCELLANEOUS) ×2 IMPLANT
SOLUTION ANTI FOG 6CC (MISCELLANEOUS) ×2
SPONGE INTESTINAL PEANUT (DISPOSABLE) ×8 IMPLANT
SPONGE TONSIL TAPE 1 RFD (DISPOSABLE) ×4 IMPLANT
STAPLE RELOAD 45 GRN (STAPLE) ×4 IMPLANT
STAPLE RELOAD 45MM GOLD (STAPLE) ×4 IMPLANT
STAPLE RELOAD 45MM GREEN (STAPLE) ×4
SUT SILK  1 MH (SUTURE) ×6
SUT SILK 1 MH (SUTURE) ×6 IMPLANT
SUT VIC AB 1 CTX 27 (SUTURE) ×4 IMPLANT
SUT VIC AB 1 CTX 36 (SUTURE) ×4
SUT VIC AB 1 CTX36XBRD ANBCTR (SUTURE) ×4 IMPLANT
SUT VIC AB 2-0 CTX 36 (SUTURE) ×4 IMPLANT
SUT VICRYL 0 UR6 27IN ABS (SUTURE) ×4 IMPLANT
SYR 20ML LL LF (SYRINGE) ×4 IMPLANT
SYR 30ML LL (SYRINGE) ×4 IMPLANT
SYSTEM SAHARA CHEST DRAIN ATS (WOUND CARE) ×4 IMPLANT
TAPE PAPER 2X10 WHT MICROPORE (GAUZE/BANDAGES/DRESSINGS) ×4 IMPLANT
TOWEL GREEN STERILE (TOWEL DISPOSABLE) ×4 IMPLANT
TRAP SPECIMEN MUCUS 40CC (MISCELLANEOUS) ×8 IMPLANT
TRAY FOLEY MTR SLVR 16FR STAT (SET/KITS/TRAYS/PACK) ×4 IMPLANT
TROCAR XCEL BLADELESS 5X75MML (TROCAR) ×4 IMPLANT
WATER STERILE IRR 1000ML POUR (IV SOLUTION) ×8 IMPLANT

## 2020-07-28 NOTE — Transfer of Care (Signed)
Immediate Anesthesia Transfer of Care Note  Patient: Lesia Sago  Procedure(s) Performed: VIDEO ASSISTED THORACOSCOPY (VATS)/EMPYEMA (Left Chest) DECORTICATION (Left )  Patient Location: PACU  Anesthesia Type:General  Level of Consciousness: awake, drowsy and patient cooperative  Airway & Oxygen Therapy: Patient Spontanous Breathing and Patient connected to face mask oxygen  Post-op Assessment: Report given to RN and Post -op Vital signs reviewed and stable  Post vital signs: Reviewed and stable  Last Vitals:  Vitals Value Taken Time  BP 134/89 07/28/20 1111  Temp    Pulse 116 07/28/20 1116  Resp 38 07/28/20 1116  SpO2 97 % 07/28/20 1116  Vitals shown include unvalidated device data.  Last Pain:  Vitals:   07/28/20 0600  TempSrc: Oral  PainSc:          Complications: No complications documented.

## 2020-07-28 NOTE — Discharge Summary (Signed)
Physician Discharge Summary  Patient ID: Lindsay Hall MRN: [id number] DOB/AGE: Dec 29, 1985 34 y.o.  Admit date: 07/27/2020 Discharge date: 08/03/2020  Admission Diagnoses:  Left empyema Acute dehydration Renovascular hypertension Acute on chronic renal insufficiency   Discharge Diagnoses:   Left empyema Acute dehydration Renovascular hypertension Acute on chronic renal insufficiency S/P Left Video-Assisted Thoracoscopic Surgery with Decortication   Discharged Condition: stable  History of Presenting Illness:  This is a 34 year old woman previously healthy who recently was hospitalized at [facility] for left lower lobe pneumonia with a loculated left pleural effusion. Course was complicated by anemia, sepsis, and acute kidney injury. She was treated with intravenous antibiotics. She had a thoracentesis at 1 point during her hospitalization. Apparently they tried several times but the one time they were able to access the chest they only got about 35 cc of fluid out. She had a CT scan which I reviewed which showed a complex loculated left pleural effusion with near complete left lower lobe atelectasis.  She was discharged on 07/05/2020. Since then she continues to have fullness or pressure in the left side of her chest. She has a nonproductive cough with pleuritic chest pain. She is not having fevers or chills but she does have continued general malaise and loss of appetite. She says that she has not been able to hold down any food or liquids for the past 48 hours. She is unable to sleep due to difficulties getting comfortable.  She appears acutely ill. She is dehydrated and very tachycardic. She needs hospital admission for hydration with IV fluids and intravenous antibiotics.Arrangements have been made for direct admission to [facility] Course:   [name] was admitted to [facility] and started on IV antibiotics and IV fluids.  She was given supplemental KCl for hypokalemia. She was transfused with 2 units PRBC's after admission for Hgb of 6.6. A non-contrast CT scan was obtained that demonstrated a complex loculated left pleural effusion.  She remained stable and was taken to the operating room on 07/28/20 where left video-assisted thoracoscopic drainage of the empyema and decortication was carried out without complication. Another unit of PRBC's was transfused in the OR.  Following the procedure, she was recovered in the PACU and later transferred back to [facility]. She was mobilized on the first post-op day.   She was treated empirically with IV vancomycin and Zosyn while awaiting the reports on the fluid cultures obtained in the OR.  These cultures grew mixed anaerobic flora and Streptococcus Constellatus.  Per sensitivities she was transitioned to Rocephin for antibiotic coverage.  It was requested that the lab also try to isolate anaerobic organisms present to ensure complete coverage.  Pain was initially controlled with a PCA.  However due to development of nausea this was discontinued and patient was able to be managed using oral agents.  Her hemoglobin level was improved to 9.5 post operatively.   IV hydration continued and renal function was monitored.  She was admitted with AKI due to sepsis and likely anemia.  Her creatinine level improved to 1.78.   The chest tube drainage and air leak subsided allowing the chset tubes to be removed on 11/29.  Follow up CXR showed typical post-op features following surgical mgt of empyema. She was seen by Dr. Juleen Hall of the ID service and after review of clinical date, he recommended converting to an oral regimen of cefdinir and Flagyl. At the time of discharge, her respiratory status was stable with unlabored  respirations and acceptable O2 saturations on RA.  Consults: Infectious disease  Significant Diagnostic Studies:   CT CHEST WITHOUT CONTRAST  TECHNIQUE: Multidetector CT imaging of  the chest was performed following the standard protocol without IV contrast.  COMPARISON:  None.  FINDINGS: Cardiovascular: The heart size is unremarkable. There is no significant pericardial effusion.  Mediastinum/Nodes:  --there is mild mediastinal adenopathy.  -- No hilar lymphadenopathy.  -- No axillary lymphadenopathy.  -- No supraclavicular lymphadenopathy.  -- Normal thyroid gland where visualized.  -  Unremarkable esophagus.  Lungs/Pleura: Evaluation of the lung fields is limited by lack of IV contrast. There is a large, loculated pleural fluid collection on the left, likely representing an empyema. There is pleural thickening and pockets of gas within this collection. There is significant compression of the left lower lobe. The trachea is unremarkable.  Upper Abdomen: There is no significant abnormality in the upper abdomen.  Musculoskeletal: No chest wall abnormality. No bony spinal canal stenosis.  IMPRESSION: 1. Evaluation limited by lack of IV contrast. 2. Findings most consistent with a large left-sided empyema as detailed above.   Electronically Signed   By: Lindsay Hall M.D.   On: 07/27/2020 22:21   Treatments:   OPERATIVE REPORT  DATE OF PROCEDURE:  07/28/2020  PREOPERATIVE DIAGNOSIS:  Left empyema.  POSTOPERATIVE DIAGNOSIS:  Organized left empyema.  PROCEDURE:  Left video-assisted thoracoscopy, drainage of empyema, visceral and parietal pleural decortication.  SURGEON:  Lindsay Charon, MD  ASSISTANT:  Lindsay Cutter, PA  ANESTHESIA:  General.  FINDINGS:  Organized empyema with foul-smelling pus, extremely thick visceral and parietal pleural peels, good reexpansion post-decortication.  CLINICAL NOTE:  The patient is a 34 year old woman recently had pneumonia with a parapneumonic effusion.  She presented with worsening chest pain, cough and general malaise.  On exam in the office, she was  tachycardic and she became dizzy.  She was  admitted for fluid resuscitation, found to be anemic, transfused, started on intravenous antibiotics and advised to undergo left VATS for drainage of empyema and decortication.  The indications, risks, benefits, and alternatives were discussed in detail  with the patient.  She accepted the risks and agreed to proceed.  Discharge Exam: Blood pressure  142/83, pulse 89, temperature 98.9 F (37.2 C), temperature source Oral, resp. rate 20, height 5\' 4"  (1.626 m), weight 99.8 kg, SpO2 96 %.  General appearance: alert, cooperative and no distress Heart: regular rate and rhythm Lungs: diminished breath sounds on left. CXR shows apical air-fluid level, pleural thickening unchanged. CT's removed yesterday.  Wound: thoracotomy incision is clean and dry, CT site dressing is dry.   Disposition:    Allergies as of 08/03/2020      Reactions   Shellfish-derived Products Other (See Comments)   No reaction listed      Medication List    TAKE these medications   cefdinir 300 MG capsule Commonly known as: OMNICEF Take 1 capsule (300 mg total) by mouth 2 (two) times daily.   Excedrin Migraine 250-250-65 MG tablet Generic drug: aspirin-acetaminophen-caffeine Take 1 tablet by mouth every 6 (six) hours as needed for headache.   metroNIDAZOLE 500 MG tablet Commonly known as: FLAGYL Take 1 tablet (500 mg total) by mouth 3 (three) times daily.   multivitamin-iron-minerals-folic acid chewable tablet Chew 1 tablet by mouth daily.   traMADol 50 MG tablet Commonly known as: ULTRAM Take 1 tablet (50 mg total) by mouth every 6 (six) hours as needed for up to 7 days (mild pain).  Follow-up Information    Melrose Nakayama, MD Follow up on 08/31/2020.   Specialty: Cardiothoracic Surgery Why: Appointment is at 10:00, please get CXR at 9:30 at [facility] located on first floor of our office building Contact information: [place] [address] [phone]        Mignon Pine, DO. Go on 09/01/2020.   Specialties: Internal Medicine, Infectious Diseases Why: Your appointment is at 9:45am.  Contact information: [address] [place] [address] [phone]                Signed: [name], PA-C 08/03/2020, 9:59 AM

## 2020-07-28 NOTE — Anesthesia Procedure Notes (Signed)
Procedure Name: Intubation Date/Time: 07/28/2020 7:48 AM Performed by: Dorthea Cove, CRNA Pre-anesthesia Checklist: Patient identified, Emergency Drugs available, Suction available and Patient being monitored Patient Re-evaluated:Patient Re-evaluated prior to induction Oxygen Delivery Method: Circle system utilized Preoxygenation: Pre-oxygenation with 100% oxygen Induction Type: IV induction Ventilation: Mask ventilation without difficulty Laryngoscope Size: Mac and 3 Grade View: Grade II Tube type: Oral Endobronchial tube: Left, Bronchial Blocker placed under direct vision, EBT position confirmed by auscultation, EBT position confirmed by fiberoptic bronchoscope and Double lumen EBT and 37 Fr Number of attempts: 1 Airway Equipment and Method: Stylet and Oral airway Placement Confirmation: ETT inserted through vocal cords under direct vision,  positive ETCO2 and breath sounds checked- equal and bilateral Secured at: 29 cm Tube secured with: Tape Dental Injury: Teeth and Oropharynx as per pre-operative assessment

## 2020-07-28 NOTE — Discharge Instructions (Signed)

## 2020-07-28 NOTE — Anesthesia Procedure Notes (Signed)
Central Venous Catheter Insertion Performed by: Albertha Ghee, MD, anesthesiologist Start/End11/24/2021 7:02 AM, 07/28/2020 7:15 AM Patient location: Pre-op. Preanesthetic checklist: patient identified, IV checked, site marked, risks and benefits discussed, surgical consent, monitors and equipment checked, pre-op evaluation, timeout performed and anesthesia consent Position: Trendelenburg Lidocaine 1% used for infiltration and patient sedated Hand hygiene performed , maximum sterile barriers used  and Seldinger technique used Catheter size: 7 Fr Central line was placed.Double lumen Procedure performed using ultrasound guided technique. Ultrasound Notes:anatomy identified, needle tip was noted to be adjacent to the nerve/plexus identified and no ultrasound evidence of intravascular and/or intraneural injection Attempts: 1 Following insertion, line sutured, dressing applied and Biopatch. Post procedure assessment: blood return through all ports, free fluid flow and no air  Patient tolerated the procedure well with no immediate complications.

## 2020-07-28 NOTE — Anesthesia Procedure Notes (Signed)
Arterial Line Insertion Start/End11/24/2021 7:00 AM, 07/28/2020 7:15 AM Performed by: Dorthea Cove, CRNA, CRNA  Patient location: Pre-op. Preanesthetic checklist: patient identified, IV checked, site marked, risks and benefits discussed, surgical consent, monitors and equipment checked, pre-op evaluation, timeout performed and anesthesia consent Lidocaine 1% used for infiltration radial was placed Catheter size: 20 Fr Hand hygiene performed  and maximum sterile barriers used   Attempts: 1 Procedure performed without using ultrasound guided technique. Following insertion, dressing applied and Biopatch. Post procedure assessment: normal and unchanged  Patient tolerated the procedure well with no immediate complications.

## 2020-07-28 NOTE — Progress Notes (Signed)
Pharmacy Antibiotic Note  Lindsay Hall is a 34 y.o. female admitted on 07/27/2020 with empyema.  Pharmacy has been consulted for vancomycin and zosyn dosing.  Plan: Vancomycin 1250 mg q 24 hrs Zosyn 3.375g IV q 8 hrs (extended interval 4 hr infusion) F/u cultures, renal function, and clinical course.    Temp (24hrs), Avg:99.3 F (37.4 C), Min:97.8 F (36.6 C), Max:100.6 F (38.1 C)  Recent Labs  Lab 07/27/20 1305 07/28/20 0031  WBC 14.1* 11.1*  CREATININE 2.82* 2.58*    Estimated Creatinine Clearance: 34.9 mL/min (A) (by C-G formula based on SCr of 2.58 mg/dL (H)).    Allergies  Allergen Reactions  . Shellfish-Derived Products Other (See Comments)    No reaction listed    Antimicrobials this admission:  Vancomycin 11/24 >  Zosyn 11/24 >   Dose adjustments this admission:   Microbiology results:  11/24 - Tissue >  11/24 Pleural fluid - GNR, GPC   Thank you for allowing pharmacy to be a part of this patient's care.  [place] Crane, Roylene Reason, BCCP Clinical Pharmacist  07/28/2020 3:14 PM   [facility] pharmacy phone numbers are listed on [email or web address]

## 2020-07-28 NOTE — Interval H&P Note (Signed)
History and Physical Interval Note:  Hgb 8 post transfusion Pregnancy test negative CT reviewed  07/28/2020 7:26 AM  Lindsay Hall  has presented today for surgery, with the diagnosis of LEFT EMPYEMA.  The various methods of treatment have been discussed with the patient and family. After consideration of risks, benefits and other options for treatment, the patient has consented to  Procedure(s): VIDEO ASSISTED THORACOSCOPY (VATS)/EMPYEMA (Left) DECORTICATION (Left) as a surgical intervention.  The patient's history has been reviewed, patient examined, no change in status, stable for surgery.  I have reviewed the patient's chart and labs.  Questions were answered to the patient's satisfaction.     Melrose Nakayama

## 2020-07-28 NOTE — Brief Op Note (Addendum)
07/27/2020 - 07/28/2020  10:56 AM  PATIENT:  Lindsay Hall  34 y.o. female  PRE-OPERATIVE DIAGNOSIS:  LEFT EMPYEMA  POST-OPERATIVE DIAGNOSIS:ORGANIZED  LEFT EMPYEMA  PROCEDURE:   VIDEO ASSISTED THORACOSCOPY (Left) DRAINAGE OF EMPYEMA DECORTICATION (Left)  SURGEON:  Melrose Nakayama, MD - Primary  PHYSICIAN ASSISTANT: Enid Cutter, PA  ANESTHESIA:   general  EBL:  800 mL   BLOOD ADMINISTERED: 1 unit PRBC's in OR  DRAINS: Left 57fr and 80fr Blake pleural drains     LOCAL MEDICATIONS USED:  Exparel local   SPECIMEN:  Source of Specimen:  Left pleural fluid. Left visceral and parietal pleural peel  DISPOSITION OF SPECIMEN:  Pathology and microbiology  COUNTS:  YES  DICTATION: .Dragon Dictation  PLAN OF CARE: Admit to inpatient   PATIENT DISPOSITION:  PACU - hemodynamically stable.   Delay start of Pharmacological VTE agent (>24hrs) due to surgical blood loss or risk of bleeding: yes

## 2020-07-29 ENCOUNTER — Inpatient Hospital Stay (HOSPITAL_COMMUNITY): Payer: Medicaid Other

## 2020-07-29 LAB — CBC
HCT: 28.5 % — ABNORMAL LOW (ref 36.0–46.0)
Hemoglobin: 9.3 g/dL — ABNORMAL LOW (ref 12.0–15.0)
MCH: 28.3 pg (ref 26.0–34.0)
MCHC: 32.6 g/dL (ref 30.0–36.0)
MCV: 86.6 fL (ref 80.0–100.0)
Platelets: 409 10*3/uL — ABNORMAL HIGH (ref 150–400)
RBC: 3.29 MIL/uL — ABNORMAL LOW (ref 3.87–5.11)
RDW: 14.6 % (ref 11.5–15.5)
WBC: 13.9 10*3/uL — ABNORMAL HIGH (ref 4.0–10.5)
nRBC: 0 % (ref 0.0–0.2)

## 2020-07-29 LAB — BASIC METABOLIC PANEL
Anion gap: 11 (ref 5–15)
BUN: 16 mg/dL (ref 6–20)
CO2: 19 mmol/L — ABNORMAL LOW (ref 22–32)
Calcium: 7.9 mg/dL — ABNORMAL LOW (ref 8.9–10.3)
Chloride: 107 mmol/L (ref 98–111)
Creatinine, Ser: 2.21 mg/dL — ABNORMAL HIGH (ref 0.44–1.00)
GFR, Estimated: 29 mL/min — ABNORMAL LOW (ref >60)
Glucose, Bld: 129 mg/dL — ABNORMAL HIGH (ref 70–99)
Potassium: 4.4 mmol/L (ref 3.5–5.1)
Sodium: 137 mmol/L (ref 135–145)

## 2020-07-29 NOTE — Op Note (Signed)
NAME: Lindsay Hall, Lindsay Hall MEDICAL RECORD UR:[id number] ACCOUNT [id number] DATE OF BIRTH:August 28, 1986 FACILITY: [facility] LOCATION: [facility] PHYSICIAN:Jeromie Gainor Chaya Jan, MD  OPERATIVE REPORT  DATE OF PROCEDURE:  07/28/2020  PREOPERATIVE DIAGNOSIS:  Left empyema.  POSTOPERATIVE DIAGNOSIS:  Organized left empyema.  PROCEDURE:  Left video-assisted thoracoscopy, drainage of empyema, visceral and parietal pleural decortication.  SURGEON:  Modesto Charon, MD  ASSISTANT:  Enid Cutter, PA  ANESTHESIA:  General.  FINDINGS:  Organized empyema with foul-smelling pus, extremely thick visceral and parietal pleural peels, good reexpansion post-decortication.  CLINICAL NOTE:  Lindsay Hall is a 34 year old woman recently treated for pneumonia with a parapneumonic effusion.  She presented to our office with worsening chest pain, cough and general malaise.  On exam in the office, she was tachycardic and she became dizzy.  She was  admitted for fluid resuscitation, found to be anemic, transfused, started on intravenous antibiotics and advised to undergo left VATS for drainage of empyema and decortication.  The indications, risks, benefits, and alternatives were discussed in detail  with the patient.  She accepted the risks and agreed to proceed.  OPERATIVE NOTE:  [place] Hall was brought to the preoperative holding area on 07/28/2020.  Anesthesia placed a central venous catheter and an arterial blood pressure monitoring line.  She was taken to the Operating Room, anesthetized and intubated.   Intravenous antibiotics were administered.  Foley catheter was placed.  Sequential compression devices were placed on the calves for DVT prophylaxis.  She was placed in a right lateral decubitus position and the left chest was prepped and draped in the  usual sterile fashion.  Single lung ventilation of the right lung was initiated and was tolerated well throughout the procedure.  A timeout was performed.  An incision was  made in the eighth interspace in the mid axillary line.  The chest was entered  bluntly with a hemostat.  A sucker was advanced into the chest and there was foul smelling thick bloody pus in the chest.  This was sent for cultures and cytology.  There was approximately 200 mL of frankly purulent fluid evacuated and then another 800  mL of murky fluid.  An incision was made in approximately the fifth interspace anterolaterally.  No rib spreading was performed during the procedure.  The rib spaces were relatively small and exposure was limited initially, but was adequate  once the initial fluid was evacuated.  Additional fluid was evacuated.  There was extensive pleural debris.  This was removed over time and was a slow and tedious process.  There were thick peels on both the visceral and parietal pleura.  Over time, the  lung was freed up from the chest wall all the way to the apex posteriorly and then posteriorly to the aorta and then inferiorly down to the costophrenic angle.  There was no fluid on the diaphragm medially and anteriorly.  The lung was densely adherent to the diaphragm in this area, but was freed up posteriorly.  The visceral pleural decortication proceeded very smoothly.  There was a thick peel, but removed relatively easily.  There were some mild tears in the pleura, but nothing of major significance.   Along the superior segment, there was an area where it became difficult to determine whether there was peel or lung and a stapler was used to remove the peel in this area.  The parietal peel was densely adherent to the parietal pleura.  This was taken  down.  The chest was intermittently irrigated with saline to  evacuate blood and clots during the completion of the decortication.  The wound was copiously irrigated with 2 liters of saline.  An additional eighth interspace incision was made anterior to  the first.  A 32-French Blake drain was placed posteriorly and a 28-French Blake drain was  placed anteriorly.  Both were directed to the apex.  They were secured to the skin with #1 silk sutures.  Dual-lung ventilation was resumed.  The working incision  was closed with 0 Vicryl fascial sutures and the subcutaneous tissue and skin were closed in standard fashion.  All sponge, needle and instrument counts were correct at the end of the procedure.  The patient was taken from the operating room to the  Postanesthetic care unit, extubated, and in good condition.  HN/NUANCE  D:07/28/2020 T:07/29/2020 JOB:[id number]

## 2020-07-29 NOTE — Progress Notes (Addendum)
[address]       [place] [address]             [phone]      1 Day Post-Op Procedure(s) (LRB): VIDEO ASSISTED THORACOSCOPY (VATS)/EMPYEMA (Left) DECORTICATION (Left)   Subjective:  Patient doing okay.  States pain medication provides her relief.  She denies N/V  Objective: Vital signs in last 24 hours: Temp:  [97.7 F (36.5 C)-98.9 F (37.2 C)] 97.9 F (36.6 C) (11/25 0721) Pulse Rate:  [85-121] 85 (11/25 0721) Cardiac Rhythm: Normal sinus rhythm (11/25 0700) Resp:  [13-39] 19 (11/25 0721) BP: (134-165)/(89-111) 162/111 (11/25 0721) SpO2:  [94 %-98 %] 97 % (11/25 0721) Arterial Line BP: (109-190)/(94-107) 111/97 (11/24 1215)  Intake/Output from previous day: 11/24 0701 - 11/25 0700 In: 4851.7 [P.O.:510; I.V.:3473.3; Blood:315; IV Piggyback:553.4] Out: 2521 [Urine:1475; Blood:800; Chest Tube:246]  General appearance: alert, cooperative and no distress Heart: regular rate and rhythm Lungs: diminished breath sounds left base Abdomen: soft, non-tender; bowel sounds normal; no masses,  no organomegaly Extremities: extremities normal, atraumatic, no cyanosis or edema Wound: clean and dry  Lab Results: Recent Labs    07/28/20 0031 07/28/20 0423 07/28/20 0957 07/29/20 0045  WBC 11.1*  --   --  13.9*  HGB 7.8*   < > 7.5* 9.3*  HCT 24.8*   < > 22.0* 28.5*  PLT 421*  --   --  409*   < > = values in this interval not displayed.   BMET:  Recent Labs    07/28/20 0031 07/28/20 0907 07/28/20 0957 07/29/20 0045  NA 136   < > 139 137  K 3.7   < > 3.8 4.4  CL 103  --   --  107  CO2 21*  --   --  19*  GLUCOSE 115*  --   --  129*  BUN 15  --   --  16  CREATININE 2.58*  --   --  2.21*  CALCIUM 8.3*  --   --  7.9*   < > = values in this interval not displayed.    PT/INR:  Recent Labs    07/27/20 1305  LABPROT 16.7*  INR 1.4*   ABG    Component Value Date/Time   PHART 7.422 07/28/2020 0957   HCO3 20.1 07/28/2020 0957   TCO2 21  (L) 07/28/2020 0957   ACIDBASEDEF 4.0 (H) 07/28/2020 0957   O2SAT 95.0 07/28/2020 0957   CBG (last 3)  No results for input(s): GLUCAP in the last 72 hours.  Assessment/Plan: S/P Procedure(s) (LRB): VIDEO ASSISTED THORACOSCOPY (VATS)/EMPYEMA (Left) DECORTICATION (Left)  1. CV- NSR, + HTN, SBP in the 160s, could be pain mediated, will monitor today and if remains elevated will start anti-hypertensive agent tomorrow.. however she will require a higher BP with elevated creatinine 2. Pulm- CXR with improvement of left pleural effusion, no pneumothorax, CT is free from air leak on suction, output 246 cc since surgery, leave on suction today 3. Renal-AKI,  reatinine at 2.21, suspect multifactorial with sepsis and malperfusion with admission Hgb of 6, will continue IV fluids today, repeat BMET in AM... ABX adjusted per pharmacy as needed 4. ID- afebrile, mild leukocytosis at 13.9, OR cultures are pending, GS showing GNR and GPC, continue broad spectrum ABX for now 5. Anemia- Acute on chronic, Hgb improved to 9.5 after multiple units of packed cells 6. Lovenox for DVT prophylaxis 7. Dispo- patient stable, monitor BP today if needed  can start anti-hypertensive tomorrow, monitor creatinine closely, its improved from yesterday, OR cultures remain pending, continue ABX, repeat CXR in AM   LOS: 2 days    Ellwood Handler, PA-C  07/29/2020 Pt seen and evaluated; agree with documentation. sCr decreasing. Continue chest tubes to suction. Teagon Kron Z. Orvan Seen, [place]

## 2020-07-30 ENCOUNTER — Encounter (HOSPITAL_COMMUNITY): Payer: Self-pay | Admitting: Thoracic Surgery (Cardiothoracic Vascular Surgery)

## 2020-07-30 ENCOUNTER — Inpatient Hospital Stay (HOSPITAL_COMMUNITY): Payer: Medicaid Other

## 2020-07-30 LAB — COMPREHENSIVE METABOLIC PANEL
ALT: 7 U/L (ref 0–44)
AST: 12 U/L — ABNORMAL LOW (ref 15–41)
Albumin: 1.3 g/dL — ABNORMAL LOW (ref 3.5–5.0)
Alkaline Phosphatase: 67 U/L (ref 38–126)
Anion gap: 11 (ref 5–15)
BUN: 15 mg/dL (ref 6–20)
CO2: 20 mmol/L — ABNORMAL LOW (ref 22–32)
Calcium: 8.3 mg/dL — ABNORMAL LOW (ref 8.9–10.3)
Chloride: 105 mmol/L (ref 98–111)
Creatinine, Ser: 2.2 mg/dL — ABNORMAL HIGH (ref 0.44–1.00)
GFR, Estimated: 29 mL/min — ABNORMAL LOW (ref >60)
Glucose, Bld: 100 mg/dL — ABNORMAL HIGH (ref 70–99)
Potassium: 3.9 mmol/L (ref 3.5–5.1)
Sodium: 136 mmol/L (ref 135–145)
Total Bilirubin: 0.4 mg/dL (ref 0.3–1.2)
Total Protein: 6.6 g/dL (ref 6.5–8.1)

## 2020-07-30 LAB — CBC
HCT: 30.4 % — ABNORMAL LOW (ref 36.0–46.0)
Hemoglobin: 9.5 g/dL — ABNORMAL LOW (ref 12.0–15.0)
MCH: 28.3 pg (ref 26.0–34.0)
MCHC: 31.3 g/dL (ref 30.0–36.0)
MCV: 90.5 fL (ref 80.0–100.0)
Platelets: 414 10*3/uL — ABNORMAL HIGH (ref 150–400)
RBC: 3.36 MIL/uL — ABNORMAL LOW (ref 3.87–5.11)
RDW: 14.9 % (ref 11.5–15.5)
WBC: 14.5 10*3/uL — ABNORMAL HIGH (ref 4.0–10.5)
nRBC: 0 % (ref 0.0–0.2)

## 2020-07-30 LAB — CYTOLOGY - NON PAP

## 2020-07-30 LAB — SURGICAL PATHOLOGY

## 2020-07-30 MED ORDER — METOCLOPRAMIDE HCL 5 MG/ML IJ SOLN
10.0000 mg | Freq: Four times a day (QID) | INTRAMUSCULAR | Status: AC
  Administered 2020-07-30 – 2020-07-31 (×5): 10 mg via INTRAVENOUS
  Filled 2020-07-30 (×5): qty 2

## 2020-07-30 NOTE — Anesthesia Postprocedure Evaluation (Signed)
Anesthesia Post Note  Patient: Lindsay Hall  Procedure(s) Performed: VIDEO ASSISTED THORACOSCOPY (VATS)/EMPYEMA (Left Chest) DECORTICATION (Left )     Patient location during evaluation: PACU Anesthesia Type: General Level of consciousness: awake and alert Pain management: pain level controlled Vital Signs Assessment: post-procedure vital signs reviewed and stable Respiratory status: spontaneous breathing, nonlabored ventilation and respiratory function stable Cardiovascular status: blood pressure returned to baseline and stable Postop Assessment: no apparent nausea or vomiting Anesthetic complications: no   No complications documented.  Last Vitals:  Vitals:   07/30/20 0751 07/30/20 0757  BP: (!) 134/92   Pulse:    Resp:  20  Temp: 36.4 C   SpO2:  93%    Last Pain:  Vitals:   07/30/20 0757  TempSrc:   PainSc: 4    Pain Goal: Patients Stated Pain Goal: 1 (07/29/20 1943)                 Audry Pili

## 2020-07-30 NOTE — Plan of Care (Signed)

## 2020-07-30 NOTE — Progress Notes (Addendum)
      [address]       [place] [address]             [phone]      2 Days Post-Op Procedure(s) (LRB): VIDEO ASSISTED THORACOSCOPY (VATS)/EMPYEMA (Left) DECORTICATION (Left) Subjective: Having nausea this morning. No vomiting or abdominal pain.  Continues to use the PCA for left chest pain.   Objective: Vital signs in last 24 hours: Temp:  [97.6 F (36.4 C)-98 F (36.7 C)] 97.6 F (36.4 C) (11/26 0751) Pulse Rate:  [92-98] 94 (11/26 0445) Cardiac Rhythm: Normal sinus rhythm (11/26 0701) Resp:  [15-25] 22 (11/26 0445) BP: (124-143)/(85-100) 134/92 (11/26 0751) SpO2:  [93 %-98 %] 93 % (11/26 0445)     Intake/Output from previous day: 11/25 0701 - 11/26 0700 In: 1100 [P.O.:600; I.V.:500] Out: 1045 [Urine:975; Chest Tube:70] Intake/Output this shift: No intake/output data recorded.  General appearance: alert, cooperative and moderate distress Neurologic: intact Heart: regular rate and rhythm Lungs: Breath sounds clear on the right, diminished on the left. No air leak and minimal drainage from left chest tubes. CXR shows small apical / lateral PTX Abdomen: Soft,non-tender. Wound: The left chest dressing is dry.  Lab Results: Recent Labs    07/29/20 0045 07/30/20 0049  WBC 13.9* 14.5*  HGB 9.3* 9.5*  HCT 28.5* 30.4*  PLT 409* 414*   BMET:  Recent Labs    07/29/20 0045 07/30/20 0049  NA 137 136  K 4.4 3.9  CL 107 105  CO2 19* 20*  GLUCOSE 129* 100*  BUN 16 15  CREATININE 2.21* 2.20*  CALCIUM 7.9* 8.3*    PT/INR:  Recent Labs    07/27/20 1305  LABPROT 16.7*  INR 1.4*   ABG    Component Value Date/Time   PHART 7.422 07/28/2020 0957   HCO3 20.1 07/28/2020 0957   TCO2 21 (L) 07/28/2020 0957   ACIDBASEDEF 4.0 (H) 07/28/2020 0957   O2SAT 95.0 07/28/2020 0957   CBG (last 3)  No results for input(s): GLUCAP in the last 72 hours.  Assessment/Plan: S/P Procedure(s) (LRB): VIDEO ASSISTED THORACOSCOPY (VATS)/EMPYEMA  (Left) DECORTICATION (Left)  -POD-2 left VATS decortication for empyema.  Operative Grams stain shows Gm positive and negative organisms but Cx's are negative to date. Will continue broad spectrum ABX coverage for now. CT's to water seal.   -Nausa- Abd exam is benign. Suspect this is related to the PCA and discussed this with her.  She is going to try to minimize the PCA and will plan to discontinue it later today if she is reasonably comfortable. Add Reglan IV q6h for 24 hours. Continue PPI.  -Acute renal insufficiency- Creat apears to have plateaued. Continue IVF at 26ml/hr for now since PO intake is poor due to nausea. Monitor.   -Anemia- from prolonged illnes and operative loses- Transfused with appropriate response and Hct is stable. Minimal ongoing losses. Monitor.  -DVT PPX- on Enoxaparin daily   LOS: 3 days    [name], PA-C [phone] 07/30/2020 Pt seen and examined; agree with documentation. Leave CT to suction. Repeat CXR in am. Nyonna Hargrove Z. Orvan Seen, [place]

## 2020-07-31 ENCOUNTER — Inpatient Hospital Stay (HOSPITAL_COMMUNITY): Payer: Medicaid Other

## 2020-07-31 ENCOUNTER — Encounter (HOSPITAL_COMMUNITY): Payer: Self-pay | Admitting: Thoracic Surgery (Cardiothoracic Vascular Surgery)

## 2020-07-31 LAB — TYPE AND SCREEN
ABO/RH(D): B POS
Antibody Screen: NEGATIVE
Unit division: 0
Unit division: 0
Unit division: 0
Unit division: 0
Unit division: 0
Unit division: 0

## 2020-07-31 LAB — BPAM RBC
Blood Product Expiration Date: 202112072359
Blood Product Expiration Date: 202112092359
Blood Product Expiration Date: 202112112359
Blood Product Expiration Date: 202112112359
Blood Product Expiration Date: 202112112359
Blood Product Expiration Date: 202112162359
ISSUE DATE / TIME: 202111231700
ISSUE DATE / TIME: 202111232241
ISSUE DATE / TIME: 202111240736
ISSUE DATE / TIME: 202111240736
Unit Type and Rh: 7300
Unit Type and Rh: 7300
Unit Type and Rh: 7300
Unit Type and Rh: 7300
Unit Type and Rh: 7300
Unit Type and Rh: 7300

## 2020-07-31 LAB — BASIC METABOLIC PANEL
Anion gap: 11 (ref 5–15)
BUN: 17 mg/dL (ref 6–20)
CO2: 20 mmol/L — ABNORMAL LOW (ref 22–32)
Calcium: 8.3 mg/dL — ABNORMAL LOW (ref 8.9–10.3)
Chloride: 108 mmol/L (ref 98–111)
Creatinine, Ser: 2.12 mg/dL — ABNORMAL HIGH (ref 0.44–1.00)
GFR, Estimated: 31 mL/min — ABNORMAL LOW (ref >60)
Glucose, Bld: 78 mg/dL (ref 70–99)
Potassium: 3.5 mmol/L (ref 3.5–5.1)
Sodium: 139 mmol/L (ref 135–145)

## 2020-07-31 MED ORDER — VANCOMYCIN HCL 1250 MG/250ML IV SOLN
1250.0000 mg | INTRAVENOUS | Status: DC
  Administered 2020-07-31: 1250 mg via INTRAVENOUS
  Filled 2020-07-31 (×2): qty 250

## 2020-07-31 MED ORDER — SODIUM CHLORIDE 0.9% FLUSH: 10.0000 mL | INTRAVENOUS | Status: DC | PRN

## 2020-07-31 NOTE — Progress Notes (Addendum)
      [address]       [place] [address]             [phone]      3 Days Post-Op Procedure(s) (LRB): VIDEO ASSISTED THORACOSCOPY (VATS)/EMPYEMA (Left) DECORTICATION (Left)   Subjective:  Patient doing better.  She has no further nausea.  Pain remains controlled with oral agents  Objective: Vital signs in last 24 hours: Temp:  [97.8 F (36.6 C)-98.7 F (37.1 C)] 98.4 F (36.9 C) (11/27 0743) Pulse Rate:  [87-100] 97 (11/27 0743) Cardiac Rhythm: Normal sinus rhythm (11/27 0700) Resp:  [14-25] 25 (11/27 0743) BP: (124-143)/(84-100) 136/100 (11/27 0743) SpO2:  [94 %-96 %] 96 % (11/27 0743)  Intake/Output from previous day: 11/26 0701 - 11/27 0700 In: 1816.3 [I.V.:984.6; IV Piggyback:831.6] Out: 1835 [Urine:1775; Chest Tube:60]  General appearance: alert, cooperative and no distress Heart: regular rate and rhythm Lungs: clear to auscultation bilaterally Abdomen: soft, non-tender; bowel sounds normal; no masses,  no organomegaly Extremities: extremities normal, atraumatic, no cyanosis or edema Wound: clean and dry  Lab Results: Recent Labs    07/29/20 0045 07/30/20 0049  WBC 13.9* 14.5*  HGB 9.3* 9.5*  HCT 28.5* 30.4*  PLT 409* 414*   BMET:  Recent Labs    07/30/20 0049 07/31/20 0415  NA 136 139  K 3.9 3.5  CL 105 108  CO2 20* 20*  GLUCOSE 100* 78  BUN 15 17  CREATININE 2.20* 2.12*  CALCIUM 8.3* 8.3*    PT/INR: No results for input(s): LABPROT, INR in the last 72 hours. ABG    Component Value Date/Time   PHART 7.422 07/28/2020 0957   HCO3 20.1 07/28/2020 0957   TCO2 21 (L) 07/28/2020 0957   ACIDBASEDEF 4.0 (H) 07/28/2020 0957   O2SAT 95.0 07/28/2020 0957   CBG (last 3)  No results for input(s): GLUCAP in the last 72 hours.  Assessment/Plan: S/P Procedure(s) (LRB): VIDEO ASSISTED THORACOSCOPY (VATS)/EMPYEMA (Left) DECORTICATION (Left)  1. CV- NSR, BP stable in the 130s-140s- not antihypertensive indicated at this  time 2. Pulm- CT w/o air leak, 60 cc output.Marland Kitchen CXR with improvement of left pleural effusion, left pneumothorax remains unchanged 3. Renal- AKI due to sepsis, and anemia- improved at 2.12 today continue IV fluids, ABX adjusted per pharmacy as needed 4. ID-afebrile, WBC is 14.5.Marland KitchenMarland Kitchen OR cultures growing Strep Constellatus and mixed anaerobic flora... sensitivities pending, continue broad spectrum coverage for now 5. Acute on chronic anemia- stable, hgb at 9.5 6. dispo- patient stable, continue current care, repeat labs, CXR in AM, continue ABX    LOS: 4 days    Ellwood Handler, PA-C 07/31/2020   Pt seen and examined; place tube to water seal repeat CXr in am Oliviarose Punch Z. Orvan Seen, [place]

## 2020-08-01 ENCOUNTER — Inpatient Hospital Stay (HOSPITAL_COMMUNITY): Payer: Medicaid Other

## 2020-08-01 LAB — BASIC METABOLIC PANEL
Anion gap: 12 (ref 5–15)
BUN: 16 mg/dL (ref 6–20)
CO2: 18 mmol/L — ABNORMAL LOW (ref 22–32)
Calcium: 8.5 mg/dL — ABNORMAL LOW (ref 8.9–10.3)
Chloride: 109 mmol/L (ref 98–111)
Creatinine, Ser: 2.19 mg/dL — ABNORMAL HIGH (ref 0.44–1.00)
GFR, Estimated: 29 mL/min — ABNORMAL LOW (ref >60)
Glucose, Bld: 89 mg/dL (ref 70–99)
Potassium: 3.4 mmol/L — ABNORMAL LOW (ref 3.5–5.1)
Sodium: 139 mmol/L (ref 135–145)

## 2020-08-01 LAB — CBC
HCT: 29.5 % — ABNORMAL LOW (ref 36.0–46.0)
Hemoglobin: 8.9 g/dL — ABNORMAL LOW (ref 12.0–15.0)
MCH: 28 pg (ref 26.0–34.0)
MCHC: 30.2 g/dL (ref 30.0–36.0)
MCV: 92.8 fL (ref 80.0–100.0)
Platelets: 438 10*3/uL — ABNORMAL HIGH (ref 150–400)
RBC: 3.18 MIL/uL — ABNORMAL LOW (ref 3.87–5.11)
RDW: 14.9 % (ref 11.5–15.5)
WBC: 12.2 10*3/uL — ABNORMAL HIGH (ref 4.0–10.5)
nRBC: 0 % (ref 0.0–0.2)

## 2020-08-01 MED ORDER — SODIUM CHLORIDE 0.9 % IV SOLN
2.0000 g | INTRAVENOUS | Status: DC
  Administered 2020-08-01 – 2020-08-02 (×2): 2 g via INTRAVENOUS
  Filled 2020-08-01 (×2): qty 20

## 2020-08-01 NOTE — Progress Notes (Signed)
Patient ambulated 300 ft on the unit, patient tolerated ambulation well, no shortness of breath or chest pain. Patient sitting in chair eating dinner.

## 2020-08-01 NOTE — Progress Notes (Addendum)
      [address]       [place] [address]             [phone]      4 Days Post-Op Procedure(s) (LRB): VIDEO ASSISTED THORACOSCOPY (VATS)/EMPYEMA (Left) DECORTICATION (Left)   Subjective:  No new complaints.  Continues to have some pain at chest tube sites.  Objective: Vital signs in last 24 hours: Temp:  [97.4 F (36.3 C)-98.5 F (36.9 C)] 98.5 F (36.9 C) (11/28 0725) Pulse Rate:  [86-96] 96 (11/28 0725) Cardiac Rhythm: Normal sinus rhythm (11/28 0702) Resp:  [19-25] 19 (11/28 0725) BP: (132-155)/(87-95) 136/91 (11/28 0725) SpO2:  [95 %-98 %] 96 % (11/28 0725) Weight:  [99.8 kg] 99.8 kg (11/27 2020)  Intake/Output from previous day: 11/27 0701 - 11/28 0700 In: 786.3 [P.O.:237; I.V.:490.5; IV Piggyback:58.8] Out: 456 [Urine:450; Chest Tube:6]  General appearance: alert, cooperative and no distress Heart: regular rate and rhythm Lungs: diminished breath sounds on left Abdomen: soft, non-tender; bowel sounds normal; no masses,  no organomegaly Extremities: extremities normal, atraumatic, no cyanosis or edema Wound: clean and dry  Lab Results: Recent Labs    07/30/20 0049 08/01/20 0022  WBC 14.5* 12.2*  HGB 9.5* 8.9*  HCT 30.4* 29.5*  PLT 414* 438*   BMET:  Recent Labs    07/31/20 0415 08/01/20 0022  NA 139 139  K 3.5 3.4*  CL 108 109  CO2 20* 18*  GLUCOSE 78 89  BUN 17 16  CREATININE 2.12* 2.19*  CALCIUM 8.3* 8.5*    PT/INR: No results for input(s): LABPROT, INR in the last 72 hours. ABG    Component Value Date/Time   PHART 7.422 07/28/2020 0957   HCO3 20.1 07/28/2020 0957   TCO2 21 (L) 07/28/2020 0957   ACIDBASEDEF 4.0 (H) 07/28/2020 0957   O2SAT 95.0 07/28/2020 0957   CBG (last 3)  No results for input(s): GLUCAP in the last 72 hours.  Assessment/Plan: S/P Procedure(s) (LRB): VIDEO ASSISTED THORACOSCOPY (VATS)/EMPYEMA (Left) DECORTICATION (Left)  1.  CV- NSR, BP stable in the 130s 2.  Pulm- CT w/o air leak  pneumothorax is stable, there is new fluid collection in the left upper lobe, leave chest tube to water seal 3. Renal- AKI due to sepsis, anemia- stable at 2.19 4. ID- OR cultures growing Strep Constellatus- spoke with pharmacy and they will order Rocephin, they were also going to contact lab to try and isolate anerobes if possible, leukocytes improved to 12 5. Acute on chronic anemia- relatively stable at 8.9, suspect drop is due to patient's menses 6. Dispo- patient stable continue current care, ABX transitioned to Rocephin per pharmacy recs, repeat CXR in AM, watch creatinine   LOS: 5 days    Ellwood Handler, PA-C 08/01/2020 Pt seen and examined; small residual PTX but no air leak. sCr stable.  Fey Coghill Z. Orvan Seen, [place]

## 2020-08-01 NOTE — Plan of Care (Signed)

## 2020-08-02 ENCOUNTER — Inpatient Hospital Stay (HOSPITAL_COMMUNITY): Payer: Medicaid Other

## 2020-08-02 DIAGNOSIS — B954 Other streptococcus as the cause of diseases classified elsewhere: Secondary | ICD-10-CM

## 2020-08-02 LAB — CBC
HCT: 25.3 % — ABNORMAL LOW (ref 36.0–46.0)
Hemoglobin: 7.5 g/dL — ABNORMAL LOW (ref 12.0–15.0)
MCH: 27.5 pg (ref 26.0–34.0)
MCHC: 29.6 g/dL — ABNORMAL LOW (ref 30.0–36.0)
MCV: 92.7 fL (ref 80.0–100.0)
Platelets: 392 10*3/uL (ref 150–400)
RBC: 2.73 MIL/uL — ABNORMAL LOW (ref 3.87–5.11)
RDW: 15 % (ref 11.5–15.5)
WBC: 10.3 10*3/uL (ref 4.0–10.5)
nRBC: 0 % (ref 0.0–0.2)

## 2020-08-02 LAB — BASIC METABOLIC PANEL
Anion gap: 9 (ref 5–15)
BUN: 13 mg/dL (ref 6–20)
CO2: 19 mmol/L — ABNORMAL LOW (ref 22–32)
Calcium: 8.4 mg/dL — ABNORMAL LOW (ref 8.9–10.3)
Chloride: 110 mmol/L (ref 98–111)
Creatinine, Ser: 2.03 mg/dL — ABNORMAL HIGH (ref 0.44–1.00)
GFR, Estimated: 32 mL/min — ABNORMAL LOW (ref >60)
Glucose, Bld: 80 mg/dL (ref 70–99)
Potassium: 3.3 mmol/L — ABNORMAL LOW (ref 3.5–5.1)
Sodium: 138 mmol/L (ref 135–145)

## 2020-08-02 MED ORDER — CEFDINIR 300 MG PO CAPS: 300.0000 mg | ORAL_CAPSULE | Freq: Two times a day (BID) | ORAL | 0 refills | Status: DC

## 2020-08-02 MED ORDER — POTASSIUM CHLORIDE CRYS ER 20 MEQ PO TBCR
20.0000 meq | EXTENDED_RELEASE_TABLET | Freq: Two times a day (BID) | ORAL | Status: AC
  Administered 2020-08-02 (×2): 20 meq via ORAL
  Filled 2020-08-02 (×2): qty 1

## 2020-08-02 MED ORDER — CEFDINIR 300 MG PO CAPS
300.0000 mg | ORAL_CAPSULE | Freq: Two times a day (BID) | ORAL | Status: DC
  Administered 2020-08-03: 300 mg via ORAL
  Filled 2020-08-02: qty 1

## 2020-08-02 MED ORDER — FE FUMARATE-B12-VIT C-FA-IFC PO CAPS
1.0000 | ORAL_CAPSULE | Freq: Three times a day (TID) | ORAL | Status: DC
  Administered 2020-08-02 – 2020-08-03 (×5): 1 via ORAL
  Filled 2020-08-02 (×6): qty 1

## 2020-08-02 MED ORDER — METRONIDAZOLE 500 MG PO TABS
500.0000 mg | ORAL_TABLET | Freq: Three times a day (TID) | ORAL | Status: DC
  Administered 2020-08-02 – 2020-08-03 (×5): 500 mg via ORAL
  Filled 2020-08-02 (×5): qty 1

## 2020-08-02 MED ORDER — METRONIDAZOLE 500 MG PO TABS: 500.0000 mg | ORAL_TABLET | Freq: Three times a day (TID) | ORAL | 0 refills | Status: DC

## 2020-08-02 NOTE — Progress Notes (Addendum)
      [address]       [place] [address]             [phone]      4 Days Post-Op Procedure(s) (LRB): VIDEO ASSISTED THORACOSCOPY (VATS)/EMPYEMA (Left) DECORTICATION (Left)   Subjective:  Awake and alert, no new concerns.  Walked in the hall yesterday. Pain controlled with Tylenol and Tramadol.   Objective: Vital signs in last 24 hours: Temp:  [98 F (36.7 C)-98.3 F (36.8 C)] 98.3 F (36.8 C) (11/29 0417) Pulse Rate:  [88-102] 90 (11/29 0417) Cardiac Rhythm: Normal sinus rhythm (11/29 0700) Resp:  [19-24] 19 (11/29 0417) BP: (136-148)/(84-95) 145/84 (11/29 0417) SpO2:  [96 %-100 %] 97 % (11/29 0417)  Intake/Output from previous day: 11/28 0701 - 11/29 0700 In: 1950.5 [I.V.:1850.5; IV Piggyback:100] Out: 0   General appearance: alert, cooperative and no distress Heart: regular rate and rhythm Lungs: diminished breath sounds on left. CXR shows small apical air-fluid level has resolved. Small lateral PTX and pleural thickening unchanged. No drainage from CTs recorded for past 24 hours. No air leak.  Wound: clean and dry  Lab Results: Recent Labs    08/01/20 0022 08/02/20 0411  WBC 12.2* 10.3  HGB 8.9* 7.5*  HCT 29.5* 25.3*  PLT 438* 392   BMET:  Recent Labs    08/01/20 0022 08/02/20 0411  NA 139 138  K 3.4* 3.3*  CL 109 110  CO2 18* 19*  GLUCOSE 89 80  BUN 16 13  CREATININE 2.19* 2.03*  CALCIUM 8.5* 8.4*    PT/INR: No results for input(s): LABPROT, INR in the last 72 hours. ABG    Component Value Date/Time   PHART 7.422 07/28/2020 0957   HCO3 20.1 07/28/2020 0957   TCO2 21 (L) 07/28/2020 0957   ACIDBASEDEF 4.0 (H) 07/28/2020 0957   O2SAT 95.0 07/28/2020 0957   CBG (last 3)  No results for input(s): GLUCAP in the last 72 hours.  Assessment/Plan: S/P Procedure(s) (LRB): VIDEO ASSISTED THORACOSCOPY (VATS)/EMPYEMA (Left) DECORTICATION (Left)  1.  CV- NSR, BP stable in the 130s 2.  Pulm- POD-5 VATS / decortication for  empyema. CT w/o air leak pneumothorax is stable, the air fluid level has resolved.  3. Renal- Acute on chronic renal insufficiency. Creat has stablized at ~2.  She has established care with a nephrologist and has scheduled follow up on Dec. 10. 4. ID- OR cultures growing Strep Constellatus- ABX changed to Rocephin over the weekend per pharmacy. WBC's have normalized.  5. Acute on chronic anemia- relatively stable at 7.5, suspect drop is due to patient's menses.  No evidence of losses via chest drainage. Will repeat in AM. Fe replacement.   Plan- will consider removal of one or both CTs today since there has been no significant drainage for 48 hours. D/C central line. Replace K+. Continue Rocephin. Monitor renal function and Hct.     LOS: 6 days    Malon Kindle  [phone] 08/02/2020 Patient seen and examined, agree with above Dc chest tubes Will ask ID to see re best options for antibiotics as outpatient  Remo Lipps C. Roxan Hockey, MD [facility] [phone]

## 2020-08-02 NOTE — Consult Note (Signed)
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  We will also add anaerobic coverage.  Avoiding Augmentin due to Strep being intermediate to PCN --Cefdinir 300 mg twice daily and metronidazole 500 mg every 8 hours.  Will obtain through medication assistance --Scheduled for hospital follow-up with me on December 29 at 9:45 AM --Will assess at that visit for final duration of antibiotics.  Plan for 4-6 weeks --Please let me know if anything else needed prior to her discharge.  Otherwise I will plan to see her in clinic.    MEDICATIONS:    Scheduled Meds: . bisacodyl  10 mg Oral Daily  . [START ON 08/03/2020] cefdinir  300 mg Oral Q12H  . Chlorhexidine Gluconate Cloth  6 each Topical Daily  . enoxaparin (LOVENOX) injection  40 mg Subcutaneous Daily  . ferrous [id number] C-folic acid  1 capsule Oral TID PC  . metroNIDAZOLE  500 mg Oral Q8H  . potassium chloride  20 mEq Oral BID  . senna-docusate  1 tablet Oral QHS    Continuous Infusions:   PRN Meds: oxyCODONE, traMADol  HPI:    Lindsay Hall is a 34 y.o. female was admitted November 23 and a noncontrast CT scan was obtained which demonstrated a complex loculated left pleural effusion.  She was stable and taken to the operating room on November 24 where left VATS drainage of the empyema and decortication was carried out without issues.  She was ambulating first postop day and was treated empirically with vancomycin and piperacillin tazobactam while awaiting results of the fluid cultures obtained in the OR.   These cultures grew mixed anaerobic flora and Streptococcus constellatus.  She was transitioned to ceftriaxone for antimicrobial coverage.  It was requested that the lab try to isolate the anaerobe to ensure complete coverage.  No drainage was recorded from chest tubes over the last 24 hours.  These were removed today and we have been consulted regarding outpatient antibiotic recommendations.   Past Medical History:  Diagnosis Date  . Cervical dysplasia   . Hx of migraines     Social History   Tobacco Use  . Smoking status: Never Smoker  . Smokeless tobacco: Never Used  Vaping Use  . Vaping Use: Never used  Substance Use Topics  . Alcohol use: No  . Drug use: No    Family History  Problem Relation Age of Onset  . Hypertension Mother   . Migraines Mother   . Migraines Sister   . Hypertension Brother   . Migraines Brother   . Hypertension Maternal Grandmother     Allergies  Allergen Reactions  . Shellfish-Derived Products Other (See Comments)    No reaction listed    Review of Systems  Constitutional: Negative for chills and fever.  Respiratory: Negative for cough and shortness of breath.   Cardiovascular: Negative.   Gastrointestinal: Negative.   Neurological: Negative.     All other systems reviewed and are negative.  OBJECTIVE:   Blood pressure (!) 154/89, pulse 78, temperature 98.5 F (36.9 C), temperature source  Oral, resp. rate (!) 23, height 5\' 4"  (1.626 m), weight 99.8 kg, SpO2 100 %. Body mass index is 37.76 kg/m.  Physical Exam Constitutional:      General: She is not in acute distress.    Appearance: Normal appearance.  HENT:     Head: Normocephalic and atraumatic.  Eyes:     Extraocular Movements: Extraocular movements intact.     Conjunctiva/sclera: Conjunctivae normal.  Cardiovascular:     Rate and Rhythm: Normal rate and regular rhythm.     Heart sounds: No murmur heard.   Pulmonary:     Effort: Pulmonary effort is normal. No  respiratory distress.     Breath sounds: Normal breath sounds.  Abdominal:     General: Abdomen is flat.     Palpations: Abdomen is soft.     Tenderness: There is no abdominal tenderness.  Skin:    General: Skin is warm and dry.  Neurological:     General: No focal deficit present.     Mental Status: She is alert and oriented to person, place, and time.  Psychiatric:        Mood and Affect: Mood normal.        Behavior: Behavior normal.      Lab Results & Microbiology Lab Results  Component Value Date   WBC 10.3 08/02/2020   HGB 7.5 (L) 08/02/2020   HCT 25.3 (L) 08/02/2020   MCV 92.7 08/02/2020   PLT 392 08/02/2020    Lab Results  Component Value Date   NA 138 08/02/2020   K 3.3 (L) 08/02/2020   CO2 19 (L) 08/02/2020   GLUCOSE 80 08/02/2020   BUN 13 08/02/2020   CREATININE 2.03 (H) 08/02/2020   CALCIUM 8.4 (L) 08/02/2020   GFRNONAA 32 (L) 08/02/2020   GFRAA 39 (L) 07/12/2020    Lab Results  Component Value Date   ALT 7 07/30/2020   AST 12 (L) 07/30/2020   ALKPHOS 67 07/30/2020   BILITOT 0.4 07/30/2020    C-Reactive Protein  No results found for: CRP  Erythrocyte Sedimentation Rate  No results found for: ESRSEDRATE    I have reviewed the micro and lab results in Epic.  Imaging DG Chest 2 View  Result Date: 08/01/2020 CLINICAL DATA:  Left-sided pneumothorax EXAM: CHEST - 2 VIEW COMPARISON:  July 31, 2020 FINDINGS: Chest tubes are unchanged in position on the left. Small left apical and apicolateral pneumothorax is stable. No tension component. Central catheter tip is in the superior vena cava. There is patchy consolidation and atelectasis in the left lower lung region. Small loculated pleural effusion noted on the left. There is an apparent fluid collection in the anterior left upper lobe region, new from 1 day prior. The right lung is clear. Heart is upper normal in size with pulmonary vascularity normal. No adenopathy. IMPRESSION: No change in chest  tube positions on the left. Small pneumothorax on the left is unchanged. There is a focal air-fluid level in the left upper lobe region anteriorly, presumably fluid within a bulla. Developing infection in this area must be of concern. There is a degree of loculated effusion on the left as well as atelectasis and consolidation in portions of the left lower lobe. Right lung clear.  Heart upper normal in size. Electronically Signed   By: Lowella Grip III M.D.   On: 08/01/2020 07:59   DG Chest Port 1 View  Result Date: 08/02/2020 CLINICAL DATA:  Chest tube removal. EXAM: PORTABLE CHEST 1 VIEW  COMPARISON:  August 02, 2020. FINDINGS: The heart size and mediastinal contours are within normal limits. Two left-sided chest tubes have been removed. No definite pneumothorax is noted. Stable pleural thickening or loculated effusion is seen in the left hemithorax with left basilar atelectasis or scarring. Right lung is clear. The visualized skeletal structures are unremarkable. IMPRESSION: Two left-sided chest tubes have been removed. Stable pleural thickening or loculated effusion is seen in the left hemithorax with left basilar atelectasis or scarring. No definite pneumothorax is noted. Electronically Signed   By: Marijo Conception M.D.   On: 08/02/2020 12:37   DG CHEST PORT 1 VIEW  Result Date: 08/02/2020 CLINICAL DATA:  Chest pain, empyema. EXAM: PORTABLE CHEST 1 VIEW COMPARISON:  August 01, 2020. FINDINGS: Stable cardiomediastinal silhouette. Stable position of 2 left-sided chest tubes without pneumothorax. Postsurgical changes are noted in left upper lobe. Stable left basilar atelectasis and effusion is noted. Right lung is unremarkable. Bony thorax is unremarkable. IMPRESSION: No active disease. Electronically Signed   By: Marijo Conception M.D.   On: 08/02/2020 08:27        [facility] [phone] pager 08/02/2020, 5:46 PM

## 2020-08-02 NOTE — Plan of Care (Signed)

## 2020-08-03 ENCOUNTER — Inpatient Hospital Stay (HOSPITAL_COMMUNITY): Payer: Medicaid Other

## 2020-08-03 ENCOUNTER — Other Ambulatory Visit (HOSPITAL_COMMUNITY): Payer: Self-pay | Admitting: Thoracic Surgery (Cardiothoracic Vascular Surgery)

## 2020-08-03 LAB — BASIC METABOLIC PANEL
Anion gap: 12 (ref 5–15)
BUN: 12 mg/dL (ref 6–20)
CO2: 17 mmol/L — ABNORMAL LOW (ref 22–32)
Calcium: 8.4 mg/dL — ABNORMAL LOW (ref 8.9–10.3)
Chloride: 110 mmol/L (ref 98–111)
Creatinine, Ser: 1.78 mg/dL — ABNORMAL HIGH (ref 0.44–1.00)
GFR, Estimated: 38 mL/min — ABNORMAL LOW (ref >60)
Glucose, Bld: 91 mg/dL (ref 70–99)
Potassium: 3.3 mmol/L — ABNORMAL LOW (ref 3.5–5.1)
Sodium: 139 mmol/L (ref 135–145)

## 2020-08-03 LAB — CBC
HCT: 25.3 % — ABNORMAL LOW (ref 36.0–46.0)
Hemoglobin: 7.8 g/dL — ABNORMAL LOW (ref 12.0–15.0)
MCH: 27.9 pg (ref 26.0–34.0)
MCHC: 30.8 g/dL (ref 30.0–36.0)
MCV: 90.4 fL (ref 80.0–100.0)
Platelets: 409 10*3/uL — ABNORMAL HIGH (ref 150–400)
RBC: 2.8 MIL/uL — ABNORMAL LOW (ref 3.87–5.11)
RDW: 14.8 % (ref 11.5–15.5)
WBC: 10.6 10*3/uL — ABNORMAL HIGH (ref 4.0–10.5)
nRBC: 0 % (ref 0.0–0.2)

## 2020-08-03 MED ORDER — TRAMADOL HCL 50 MG PO TABS: 50.0000 mg | ORAL_TABLET | Freq: Four times a day (QID) | ORAL | 0 refills | Status: DC | PRN

## 2020-08-03 MED ORDER — CEFDINIR 300 MG PO CAPS: 300.0000 mg | ORAL_CAPSULE | Freq: Two times a day (BID) | ORAL | 0 refills | Status: DC

## 2020-08-03 MED ORDER — METRONIDAZOLE 500 MG PO TABS: 500.0000 mg | ORAL_TABLET | Freq: Three times a day (TID) | ORAL | 0 refills | Status: DC

## 2020-08-03 MED ORDER — CENTRUM PO CHEW: 1.0000 | CHEWABLE_TABLET | Freq: Every day | ORAL | 0 refills | Status: DC

## 2020-08-03 MED ORDER — POTASSIUM CHLORIDE CRYS ER 20 MEQ PO TBCR
30.0000 meq | EXTENDED_RELEASE_TABLET | Freq: Once | ORAL | Status: AC
  Administered 2020-08-03: 30 meq via ORAL
  Filled 2020-08-03: qty 1

## 2020-08-03 MED FILL — traMADol HCL 50 MG TABS: 50 | 7 days supply | Qty: 28 | Fill #0

## 2020-08-03 MED FILL — CERTAVITE/ANTIOXIDANTS TABS: 30 days supply | Qty: 30 | Fill #0

## 2020-08-03 MED FILL — metroNIDAZOLE 500 MG TABS: 500 | 30 days supply | Qty: 90 | Fill #0

## 2020-08-03 MED FILL — CEFDINIR 300 MG CAPSULE: 300 | 30 days supply | Qty: 60 | Fill #0

## 2020-08-03 NOTE — Progress Notes (Addendum)
      [address]       ,[place] [address]             [phone]      4 Days Post-Op Procedure(s) (LRB): VIDEO ASSISTED THORACOSCOPY (VATS)/EMPYEMA (Left) DECORTICATION (Left)   Subjective:  Awake and alert, no new concerns.  Continues to progress with ambulation. Pain control adequate.    Objective: Vital signs in last 24 hours: Temp:  [97.3 F (36.3 C)-98.9 F (37.2 C)] 98.9 F (37.2 C) (11/30 0404) Pulse Rate:  [78-94] 89 (11/30 0404) Cardiac Rhythm: Normal sinus rhythm (11/30 0404) Resp:  [20-24] 20 (11/30 0404) BP: (139-154)/(83-89) 142/83 (11/30 0404) SpO2:  [96 %-100 %] 96 % (11/30 0404)  Intake/Output from previous day: 11/29 0701 - 11/30 0700 In: -  Out: 21 [Urine:1; Chest Tube:20]  General appearance: alert, cooperative and no distress Heart: regular rate and rhythm Lungs: diminished breath sounds on left. CXR shows apical air-fluid level, pleural thickening unchanged. CT's removed yesterday.  Wound: thoracotomy incision is clean and dry, CT site dressing is dry.   Lab Results: Recent Labs    08/02/20 0411 08/03/20 0108  WBC 10.3 10.6*  HGB 7.5* 7.8*  HCT 25.3* 25.3*  PLT 392 409*   BMET:  Recent Labs    08/02/20 0411 08/03/20 0108  NA 138 139  K 3.3* 3.3*  CL 110 110  CO2 19* 17*  GLUCOSE 80 91  BUN 13 12  CREATININE 2.03* 1.78*  CALCIUM 8.4* 8.4*    PT/INR: No results for input(s): LABPROT, INR in the last 72 hours. ABG    Component Value Date/Time   PHART 7.422 07/28/2020 0957   HCO3 20.1 07/28/2020 0957   TCO2 21 (L) 07/28/2020 0957   ACIDBASEDEF 4.0 (H) 07/28/2020 0957   O2SAT 95.0 07/28/2020 0957   CBG (last 3)  No results for input(s): GLUCAP in the last 72 hours.  Assessment/Plan: S/P Procedure(s) (LRB): VIDEO ASSISTED THORACOSCOPY (VATS)/EMPYEMA (Left) DECORTICATION (Left)  1.  CV- NSR, BP 140-150 2.  Pulm- POD-6 VATS / decortication for empyema.  CT's out. Apical air fluid level on CXR.  3.  Renal- Acute on chronic renal insufficiency. Creat trending down.  She has established care with a nephrologist and has scheduled follow up on Dec. 10. 4. ID- Appreciate input from ID. Now on oral cefdinir and Flagyl. ID f/u has been planned. 5. Acute on chronic anemia-Hct trending up,  suspect drop is due to patient's menses.  continue Fe replacement.   Plan- Possible discharge later today. Will review CXR with Dr. Roxan Hockey.    LOS: 7 days    [name], PA-C  [phone] 08/03/2020 Patient seen and examined, agree with above CXR has a fairly typical post decortication appearance, will continue to evolve with time OK for DC Follow up with me, Nephrology and ID arranged  Remo Lipps C. Roxan Hockey, MD [facility] [phone]

## 2020-08-03 NOTE — TOC Transition Note (Signed)
Transition of Care [facility]) - CM/SW Discharge Note   Patient Details  Name: [name] MRN: [id number] Date of Birth: Mar 08, 1986  [facility]) CM/SW Contact:  Zenon Mayo, RN Phone Number: 08/03/2020, 12:30 PM   Clinical Narrative:    NCM spoke with patient, informed her that she was assisted with [facility] for her meds and that she has a follow up with [facility].  She has transportation at [employer].  TOC is filling her meds for her and her fiance will pay for the meds.    Final next level of care: Home/Self Care Barriers to Discharge: No Barriers Identified   Patient Goals and CMS Choice Patient states their goals for this hospitalization and ongoing recovery are:: get better   Choice offered to / list presented to : NA  Discharge Placement                       Discharge Plan and Services                  DME Agency: NA       HH Arranged: NA          Social Determinants of Health (SDOH) Interventions     Readmission Risk Interventions No flowsheet data found.

## 2020-08-04 LAB — AEROBIC/ANAEROBIC CULTURE W GRAM STAIN (SURGICAL/DEEP WOUND)

## 2020-08-05 LAB — AEROBIC/ANAEROBIC CULTURE W GRAM STAIN (SURGICAL/DEEP WOUND)

## 2020-08-10 ENCOUNTER — Ambulatory Visit (INDEPENDENT_AMBULATORY_CARE_PROVIDER_SITE_OTHER): Payer: Self-pay

## 2020-08-10 ENCOUNTER — Other Ambulatory Visit: Payer: Self-pay

## 2020-08-10 DIAGNOSIS — Z4802 Encounter for removal of sutures: Secondary | ICD-10-CM

## 2020-08-10 NOTE — Progress Notes (Signed)
Patient arrived for nurse visit to remove 2 sutures post- procedure VATS/ empyema with Dr. Roxan Hockey 07/28/2020.  Sutures removed with no signs/ symptoms of infection noted.  Patient tolerated procedure well.  Site remained intact with no dehiscence noted.  Distal site had significant scabbing, some removed when suture removed.  Patient instructed to keep the incision sites clean and dry and notify the office for signs/ symptoms of infection.  Advised that if she noticed any clear drainage from the incision site that this is normal and can sometimes happen, but if the drainage becomes thick, white, odorous with redness, increased pain/ swelling with a temperature to contact the office.   Patient acknowledged instructions given.

## 2020-08-12 DIAGNOSIS — N179 Acute kidney failure, unspecified: Secondary | ICD-10-CM | POA: Diagnosis not present

## 2020-08-20 ENCOUNTER — Other Ambulatory Visit: Payer: Self-pay | Admitting: Thoracic Surgery (Cardiothoracic Vascular Surgery)

## 2020-08-20 DIAGNOSIS — J869 Pyothorax without fistula: Secondary | ICD-10-CM

## 2020-08-31 ENCOUNTER — Encounter: Payer: Self-pay | Admitting: Thoracic Surgery (Cardiothoracic Vascular Surgery)

## 2020-08-31 ENCOUNTER — Other Ambulatory Visit: Payer: Self-pay

## 2020-08-31 ENCOUNTER — Ambulatory Visit
Admission: RE | Admit: 2020-08-31 | Discharge: 2020-08-31 | Disposition: A | Discharge status: Home / Self Care | Payer: Medicaid Other | Source: Ambulatory Visit | Attending: Thoracic Surgery (Cardiothoracic Vascular Surgery) | Admitting: Thoracic Surgery (Cardiothoracic Vascular Surgery)

## 2020-08-31 ENCOUNTER — Ambulatory Visit (INDEPENDENT_AMBULATORY_CARE_PROVIDER_SITE_OTHER): Payer: Self-pay | Admitting: Thoracic Surgery (Cardiothoracic Vascular Surgery)

## 2020-08-31 VITALS — BP 148/97 | HR 89 | Temp 97.8°F | Resp 20 | Ht 64.0 in | Wt 223.0 lb

## 2020-08-31 DIAGNOSIS — J439 Emphysema, unspecified: Secondary | ICD-10-CM | POA: Diagnosis not present

## 2020-08-31 DIAGNOSIS — J869 Pyothorax without fistula: Secondary | ICD-10-CM

## 2020-08-31 DIAGNOSIS — J90 Pleural effusion, not elsewhere classified: Secondary | ICD-10-CM

## 2020-08-31 DIAGNOSIS — Z09 Encounter for follow-up examination after completed treatment for conditions other than malignant neoplasm: Secondary | ICD-10-CM

## 2020-08-31 DIAGNOSIS — R918 Other nonspecific abnormal finding of lung field: Secondary | ICD-10-CM | POA: Diagnosis not present

## 2020-08-31 DIAGNOSIS — J929 Pleural plaque without asbestos: Secondary | ICD-10-CM | POA: Diagnosis not present

## 2020-08-31 NOTE — Progress Notes (Signed)
[address]       [place] [address]             [phone]     HPI: Ms. Jhaveri returns for a scheduled postoperative follow-up visit  Lindsay Hall is a 34 year old woman who was hospitalized at [facility] with a left lower lobe pneumonia and loculated left pleural effusion back in October. She was treated with intravenous antibiotics. Her course was complicated by anemia, sepsis, and acute kidney injury. A CT during that admission showed a complex loculated left pleural effusion.  I saw her in late November with continued fullness and pressure in the left chest. She wasn't have any fevers but she did have failure to thrive. We had to admit her because she hadn't had any thing to eat or drink in 48 h prior to her office visit.  I did a left VATS for drainage of an empyema and decortication on 07/28/2020. Her postoperative course was unremarkable. She was seen by infectious disease and was prescribed a 4-week course of cefdinir and metronidazole.  Since discharge she has been feeling well. Her breathing is significantly improved. She does have some incisional discomfort but is not taking any narcotics. She is anxious to return to work.  Past Medical History:  Diagnosis Date  . Cervical dysplasia   . Hx of migraines     Current Outpatient Medications  Medication Sig Dispense Refill  . aspirin-acetaminophen-caffeine (EXCEDRIN MIGRAINE) 250-250-65 MG tablet Take 1 tablet by mouth every 6 (six) hours as needed for headache.     . cefdinir (OMNICEF) 300 MG capsule Take 1 capsule (300 mg total) by mouth 2 (two) times daily. 60 capsule 0  . metroNIDAZOLE (FLAGYL) 500 MG tablet Take 1 tablet (500 mg total) by mouth 3 (three) times daily. 90 tablet 0  . multivitamin-iron-minerals-folic acid (CENTRUM) chewable tablet Chew 1 tablet by mouth daily. 30 tablet 0   No current facility-administered medications for this visit.    Physical Exam BP (!) 148/97   Pulse 89   Temp  97.8 F (36.6 C) (Skin)   Resp 20   Ht 5\' 4"  (1.626 m)   Wt 223 lb (101.2 kg)   LMP 08/31/2020 (Exact Date)   SpO2 98% Comment: RA  BMI 38.6 kg/m  34 year old woman in no acute distress Alert and oriented x3 with no focal deficits Lungs slightly diminished at left base but otherwise clear Incisions well-healed No peripheral edema  Diagnostic Tests: CHEST - 2 VIEW  COMPARISON:  08/03/2020 and prior.  FINDINGS: Left upper lung postsurgical sequela. No pneumothorax or focal consolidation. Left pleural thickening with blunting of the costophrenic sulcus, less conspicuous than prior exam. Prior left upper lung air-fluid level is not demonstrated. Cardiomediastinal silhouette within normal limits. No acute osseous abnormality.  IMPRESSION: Left lung postsurgical sequela.  Decreased conspicuity of left pleural thickening/blunting of the left costophrenic sulcus.   Electronically Signed   By: Primitivo Gauze M.D.   On: 08/31/2020 09:46 I personally reviewed the chest x-ray images. Shows postoperative changes. Overall good result post decortication.  Impression: Lindsay Hall is a 34 year old woman who had a pneumonia complicated by an empyema. I did a left VATS for drainage of the empyema and decortication on 07/28/2020. She did well postoperatively. Cultures grew multiple different bacteria including Streptococcus constellatus, Prevotella buccae and parvimonus micra. She was seen by infectious disease and was discharged on cefdinir and metronidazole.  From surgical standpoint she is doing well. Her incisions  are well-healed. She is having minimal discomfort. There are no restrictions on her activities, but she was cautioned to build into new activities gradually. I think she should be able to return to work around 01 February.  She has an appointment with infectious disease tomorrow.  Plan: She does not need any specific thoracic surgical follow-up, but I will be  happy to see her back anytime if I can be of any further assistance with her care  Melrose Nakayama, MD [facility] [phone]

## 2020-09-01 ENCOUNTER — Encounter: Payer: Self-pay | Admitting: Internal Medicine

## 2020-09-01 ENCOUNTER — Ambulatory Visit (INDEPENDENT_AMBULATORY_CARE_PROVIDER_SITE_OTHER): Payer: Medicaid Other | Admitting: Internal Medicine

## 2020-09-01 VITALS — BP 139/97 | HR 105 | Temp 97.6°F | Ht 62.0 in | Wt 223.0 lb

## 2020-09-01 DIAGNOSIS — J869 Pyothorax without fistula: Secondary | ICD-10-CM | POA: Diagnosis not present

## 2020-09-01 DIAGNOSIS — N179 Acute kidney failure, unspecified: Secondary | ICD-10-CM | POA: Diagnosis not present

## 2020-09-01 LAB — COMPLETE METABOLIC PANEL WITH GFR
AG Ratio: 0.8 (calc) — ABNORMAL LOW (ref 1.0–2.5)
ALT: 6 U/L (ref 6–29)
AST: 11 U/L (ref 10–30)
Albumin: 2.8 g/dL — ABNORMAL LOW (ref 3.6–5.1)
Alkaline phosphatase (APISO): 81 U/L (ref 31–125)
BUN/Creatinine Ratio: 6 (calc) (ref 6–22)
BUN: 9 mg/dL (ref 7–25)
CO2: 23 mmol/L (ref 20–32)
Calcium: 9 mg/dL (ref 8.6–10.2)
Chloride: 108 mmol/L (ref 98–110)
Creat: 1.44 mg/dL — ABNORMAL HIGH (ref 0.50–1.10)
GFR, Est African American: 54 mL/min/{1.73_m2} — ABNORMAL LOW (ref >=60)
GFR, Est Non African American: 47 mL/min/{1.73_m2} — ABNORMAL LOW (ref >=60)
Globulin: 3.7 g/dL (calc) (ref 1.9–3.7)
Glucose, Bld: 98 mg/dL (ref 65–99)
Potassium: 4.2 mmol/L (ref 3.5–5.3)
Sodium: 139 mmol/L (ref 135–146)
Total Bilirubin: 0.2 mg/dL (ref 0.2–1.2)
Total Protein: 6.5 g/dL (ref 6.1–8.1)

## 2020-09-01 LAB — CBC
HCT: 34.9 % — ABNORMAL LOW (ref 35.0–45.0)
Hemoglobin: 11.8 g/dL (ref 11.7–15.5)
MCH: 30.4 pg (ref 27.0–33.0)
MCHC: 33.8 g/dL (ref 32.0–36.0)
MCV: 89.9 fL (ref 80.0–100.0)
MPV: 11.3 fL (ref 7.5–12.5)
Platelets: 338 10*3/uL (ref 140–400)
RBC: 3.88 10*6/uL (ref 3.80–5.10)
RDW: 17.1 % — ABNORMAL HIGH (ref 11.0–15.0)
WBC: 8.1 10*3/uL (ref 3.8–10.8)

## 2020-09-01 NOTE — Assessment & Plan Note (Addendum)
She is doing well after having VATS drainage of empyema and decortication on 07/28/20.  Cultures grew mixed anaerobic flora and Strep constellatus.  She will complete 5 weeks of antibiotics tomorrow and is doing very well from a symptomatic standpoint.  She saw CT surgery yesterday and follow up CXR was improved from prior.  PLAN: -- complete cefdinir and metronidazole on 09/02/20 to complete 5 weeks of antibiotics from date of surgery.  I am hopeful this will have been an adequate course for her severe empyema infection  -- discussed getting repeat CT scan to serve as new baseline and ensure stability after antibiotics.  Will check CT chest w/o contrast due to prior AKI.  Pt had negative pregnancy test in November and she reports currently being on period -- CBC and CMP today -- follow up 2-4 weeks after CT

## 2020-09-01 NOTE — Patient Instructions (Signed)
Thank you for coming to see me today. It was a pleasure seeing you.  Please finish your course of antibiotics.  I have ordered labs today and a repeat CT scan to be scheduled in the next 1-2 weeks.  I will update you with the results when they are back.  If you have any questions or concerns, please do not hesitate to call the office at [phone].  Take Care,   Jule Ser, DO

## 2020-09-01 NOTE — Assessment & Plan Note (Signed)
She had AKI on admission and at discharge her creatinine was 1.78.  PLAN: -- will repeat CMP today to ensure improved

## 2020-09-01 NOTE — Progress Notes (Signed)
[place] for Infectious Disease  Reason for Consult:  Hospital follow up for empyema  Referring Provider: Dr Roxan Hockey   HPI:    Lindsay Hall is a 34 y.o. female who presents to clinic for hospital follow up of an empyema.     She was admitted at [facility] back in November and taken to the OR Nov 24 for VATS drainage of an empyema and decortication.  She did well post-operatively and was initially managed with broad spectrum antibiotics before being de-escalated to ceftriaxone.  Her cultures grew mixed anaerobic flora and Strep constellatus (intermediate to PCN).  She was ultimately discharged on PO cefdinir and metronidazole which she has been taking since discharge.  She saw CT surgery yesterday.  CXR was reviewed and showed post-surgical sequela in left lung with decreased left pleural thickening.  Since discharge, she is feeling well with improvement in her breathing.  She is not coughing, having fevers, or significantly short of breath.  No diarrhea, no vomiting.  Reports nausea the past 3-4 days.    Patient's Medications  New Prescriptions   No medications on file  Previous Medications   ASPIRIN-ACETAMINOPHEN-CAFFEINE (EXCEDRIN MIGRAINE) 250-250-65 MG TABLET    Take 1 tablet by mouth every 6 (six) hours as needed for headache.    CEFDINIR (OMNICEF) 300 MG CAPSULE    Take 1 capsule (300 mg total) by mouth 2 (two) times daily.   METRONIDAZOLE (FLAGYL) 500 MG TABLET    Take 1 tablet (500 mg total) by mouth 3 (three) times daily.   MULTIVITAMIN-IRON-MINERALS-FOLIC ACID (CENTRUM) CHEWABLE TABLET    Chew 1 tablet by mouth daily.  Modified Medications   No medications on file  Discontinued Medications   No medications on file      Past Medical History:  Diagnosis Date  . Cervical dysplasia   . Hx of migraines     Social History   Tobacco Use  . Smoking status: Never Smoker  . Smokeless tobacco: Never Used  Vaping Use  . Vaping Use: Never used  Substance Use  Topics  . Alcohol use: No  . Drug use: No    Family History  Problem Relation Age of Onset  . Hypertension Mother   . Migraines Mother   . Migraines Sister   . Hypertension Brother   . Migraines Brother   . Hypertension Maternal Grandmother     Allergies  Allergen Reactions  . Shellfish-Derived Products Other (See Comments)    No reaction listed    Review of Systems  Constitutional: Negative for chills and fever.  Respiratory: Negative for cough, sputum production and shortness of breath.   Cardiovascular: Positive for chest pain (incisional pain).  Gastrointestinal: Positive for nausea. Negative for abdominal pain, diarrhea and vomiting.      OBJECTIVE:    Vitals:   09/01/20 0950  BP: (!) 139/97  Pulse: (!) 105  Temp: 97.6 F (36.4 C)  TempSrc: Oral  SpO2: 98%  Weight: 223 lb (101.2 kg)  Height: 5\' 2"  (1.575 m)     Body mass index is 40.79 kg/m.  Physical Exam Constitutional:      General: She is not in acute distress.    Appearance: Normal appearance.  HENT:     Head: Normocephalic and atraumatic.  Cardiovascular:     Rate and Rhythm: Normal rate and regular rhythm.     Heart sounds: Normal heart sounds.  Pulmonary:     Effort: Pulmonary effort is normal.  Breath sounds: Normal breath sounds. No rhonchi.  Neurological:     General: No focal deficit present.     Mental Status: She is alert and oriented to person, place, and time.  Psychiatric:        Mood and Affect: Mood normal.        Behavior: Behavior normal.      Pertinent Labs and Microbiology  CBC Latest Ref Rng & Units 08/03/2020 08/02/2020 08/01/2020  WBC 4.0 - 10.5 K/uL 10.6(H) 10.3 12.2(H)  Hemoglobin 12.0 - 15.0 g/dL 7.8(L) 7.5(L) 8.9(L)  Hematocrit 36.0 - 46.0 % 25.3(L) 25.3(L) 29.5(L)  Platelets 150 - 400 K/uL 409(H) 392 438(H)   CMP Latest Ref Rng & Units 08/03/2020 08/02/2020 08/01/2020  Glucose 70 - 99 mg/dL 91 80 89  BUN 6 - 20 mg/dL 12 13 16   Creatinine 0.44 - 1.00  mg/dL 1.78(H) 2.03(H) 2.19(H)  Sodium 135 - 145 mmol/L 139 138 139  Potassium 3.5 - 5.1 mmol/L 3.3(L) 3.3(L) 3.4(L)  Chloride 98 - 111 mmol/L 110 110 109  CO2 22 - 32 mmol/L 17(L) 19(L) 18(L)  Calcium 8.9 - 10.3 mg/dL 8.4(L) 8.4(L) 8.5(L)  Total Protein 6.5 - 8.1 g/dL - - -  Total Bilirubin 0.3 - 1.2 mg/dL - - -  Alkaline Phos 38 - 126 U/L - - -  AST 15 - 41 U/L - - -  ALT 0 - 44 U/L - - -     No results found for this or any previous visit (from the past 240 hour(s)).  Imaging CXR 08/31/20 FINDINGS: Left upper lung postsurgical sequela. No pneumothorax or focal consolidation. Left pleural thickening with blunting of the costophrenic sulcus, less conspicuous than prior exam. Prior left upper lung air-fluid level is not demonstrated. Cardiomediastinal silhouette within normal limits. No acute osseous abnormality.  IMPRESSION: Left lung postsurgical sequela.  Decreased conspicuity of left pleural thickening/blunting of the left costophrenic sulcus.   ASSESSMENT & PLAN:    Empyema, left ([place]) She is doing well after having VATS drainage of empyema and decortication on 07/28/20.  Cultures grew mixed anaerobic flora and Strep constellatus.  She will complete 5 weeks of antibiotics tomorrow and is doing very well from a symptomatic standpoint.  She saw CT surgery yesterday and follow up CXR was improved from prior.  PLAN: -- complete cefdinir and metronidazole on 09/02/20 to complete 5 weeks of antibiotics from date of surgery.  I am hopeful this will have been an adequate course for her severe empyema infection  -- discussed getting repeat CT scan to serve as new baseline and ensure stability after antibiotics.  Will check CT chest w/o contrast due to prior AKI.  Pt had negative pregnancy test in November and she reports currently being on period -- CBC and CMP today -- follow up 2-4 weeks after CT   Acute kidney injury [facility]) She had AKI on admission and at discharge her  creatinine was 1.78.  PLAN: -- will repeat CMP today to ensure improved    Orders Placed This Encounter  Procedures  . CT CHEST WO CONTRAST    Standing Status:   Future    Standing Expiration Date:   09/01/2021    Order Specific Question:   Is patient pregnant?    Answer:   No    Order Specific Question:   Preferred imaging location?    Answer:   [facility]  . CBC  . COMPLETE METABOLIC PANEL WITH GFR  [facility] 09/01/2020, 10:16 AM

## 2020-09-06 ENCOUNTER — Telehealth: Payer: Self-pay

## 2020-09-06 NOTE — Telephone Encounter (Signed)
Spoke with patient to let her know per Dr. Juleen China that her blood work looks good, WBC is normal and her creatinine is improved. Advised patient that Dr. Juleen China will follow up with her after her CT scan in a few days. Patient verbalized understanding and has no further questions.   Beryle Flock, RN

## 2020-09-06 NOTE — Telephone Encounter (Signed)
-----   Message from Mignon Pine, DO sent at 09/06/2020  2:34 PM EST ----- Can you pls let pt know that her blood work looked good.  WBC is normal and creatinine from her kidney injury in the hospital is also improved.  I will follow up with her after she has her CT scan in a couple days.  Thanks!

## 2020-09-07 ENCOUNTER — Telehealth: Payer: Self-pay

## 2020-09-07 NOTE — Telephone Encounter (Signed)
Received voicemail from [place] at [facility]. She states patient is scheduled for chest CT tomorrow 09/08/20. Patient's [employer] will require pre-certification. Referral coordinator made aware.   Beryle Flock, RN

## 2020-09-08 ENCOUNTER — Other Ambulatory Visit: Payer: Self-pay

## 2020-09-08 ENCOUNTER — Ambulatory Visit (HOSPITAL_COMMUNITY)
Admission: RE | Admit: 2020-09-08 | Discharge: 2020-09-08 | Disposition: A | Discharge status: Home / Self Care | Payer: Medicaid Other | Source: Ambulatory Visit | Attending: Internal Medicine | Admitting: Internal Medicine

## 2020-09-08 DIAGNOSIS — J9811 Atelectasis: Secondary | ICD-10-CM | POA: Diagnosis not present

## 2020-09-08 DIAGNOSIS — J869 Pyothorax without fistula: Secondary | ICD-10-CM | POA: Insufficient documentation

## 2020-09-08 DIAGNOSIS — J90 Pleural effusion, not elsewhere classified: Secondary | ICD-10-CM | POA: Diagnosis not present

## 2020-09-08 DIAGNOSIS — N179 Acute kidney failure, unspecified: Secondary | ICD-10-CM | POA: Diagnosis present

## 2020-09-08 DIAGNOSIS — R918 Other nonspecific abnormal finding of lung field: Secondary | ICD-10-CM | POA: Diagnosis not present

## 2020-09-10 ENCOUNTER — Encounter: Payer: Self-pay | Admitting: Medical-Surgical

## 2020-09-10 ENCOUNTER — Other Ambulatory Visit: Payer: Self-pay

## 2020-09-10 ENCOUNTER — Ambulatory Visit (INDEPENDENT_AMBULATORY_CARE_PROVIDER_SITE_OTHER): Payer: Medicaid Other | Admitting: Medical-Surgical

## 2020-09-10 VITALS — BP 148/102 | HR 80 | Temp 98.9°F | Ht 62.0 in | Wt 226.3 lb

## 2020-09-10 DIAGNOSIS — N179 Acute kidney failure, unspecified: Secondary | ICD-10-CM

## 2020-09-10 DIAGNOSIS — I10 Essential (primary) hypertension: Secondary | ICD-10-CM

## 2020-09-10 DIAGNOSIS — J869 Pyothorax without fistula: Secondary | ICD-10-CM | POA: Diagnosis not present

## 2020-09-10 MED ORDER — AMLODIPINE BESYLATE 5 MG PO TABS: 5.0000 mg | ORAL_TABLET | Freq: Every day | ORAL | 1 refills | Status: DC

## 2020-09-10 NOTE — Progress Notes (Signed)
Subjective:    CC: General follow-up  HPI: Pleasant 35 year old female presenting today for follow-up.  Recent health concerns include AKI, dehydration, anemia, pleural effusion with empyema, and left lung pneumonia.  She has been evaluated by both nephrology and cardiothoracic surgery.  She is now being followed by infectious disease.  She had a VATS procedure on 11/24 and has been recovering well without complications.  Today she presents, looking amazing and feeling much better overall.  She does still have some discomfort in the left side of her chest but is feeling like a whole new person.  Her blood pressure is elevated at 148/102 today and has had several recent readings that were similar.  No previous history of hypertension but quite a few of her family members have that diagnosis.  Her kidney function has begun to improve although she is not at baseline yet. Denies CP, SOB, palpitations, lower extremity edema, dizziness, headaches, or vision changes.  She has not returned back to work yet but plans to do so in another 3 to 4 weeks.  After having such significant nausea and vomiting, her tolerance of foods is lower and she is not able to eat previously enjoyed foods.  She does not usually add salt to her food and she is not eating a lot of processed foods.  Not currently exercising due to recent health concerns.  She is open to starting patient for blood pressure management if needed.  Has a blood pressure monitor available to monitor her readings.  I reviewed the past medical history, family history, social history, surgical history, and allergies today and no changes were needed.  Please see the problem list section below in epic for further details.  Past Medical History: Past Medical History:  Diagnosis Date  . Cervical dysplasia   . Hx of migraines    Past Surgical History: Past Surgical History:  Procedure Laterality Date  . CERVICAL ABLATION  2009  . DECORTICATION Left 07/28/2020    Procedure: DECORTICATION;  Surgeon: Melrose Nakayama, MD;  Location: [facility];  Service: Thoracic;  Laterality: Left;  . LEEP    . 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 VIDEO ASSISTED THORACOSCOPY (VATS)/EMPYEMA Left 07/28/2020   Procedure: VIDEO ASSISTED THORACOSCOPY (VATS)/EMPYEMA;  Surgeon: Melrose Nakayama, MD;  Location: [facility] OR;  Service: Thoracic;  Laterality: Left;   Social History: Social History   Socioeconomic History  . Marital status: Significant Other    Spouse name: Not on file  . Number of children: Not on file  . Years of education: Not on file  . Highest education level: Not on file  Occupational History  . Not on file  Tobacco Use  . Smoking status: Never Smoker  . Smokeless tobacco: Never Used  Vaping Use  . Vaping Use: Never used  Substance and Sexual Activity  . Alcohol use: No  . Drug use: No  . Sexual activity: Yes    Birth control/protection: None  Other Topics Concern  . Not on file  Social History Narrative  . Not on file   Social Determinants of Health   Financial Resource Strain: Not on file  Food Insecurity: Not on file  Transportation Needs: Not on file  Physical Activity: Not on file  Stress: Not on file  Social Connections: Not on file   Family History: Family History  Problem Relation Age of  Onset  . Hypertension Mother   . Migraines Mother   . Migraines Sister   . Hypertension Brother   . Migraines Brother   . Hypertension Maternal Grandmother    Allergies: Allergies  Allergen Reactions  . Shellfish-Derived Products Other (See Comments)    No reaction listed   Medications: See med rec.  Review of Systems: See HPI for pertinent positives and negatives.   Objective:    General: Well Developed, well nourished, and in no acute distress.  Neuro: Alert and oriented x3.  HEENT:  Normocephalic, atraumatic.  Skin: Warm and dry. Cardiac: Regular rate and rhythm, no murmurs rubs or gallops, no lower extremity edema.  Respiratory: Clear to auscultation bilaterally. Not using accessory muscles, speaking in full sentences.   Impression and Recommendations:    1. Hypertension, unspecified type Discussed the role of acute kidney injury and her recent health concerns in hypertension.  This may be a transient elevation due to the last few months but for now, recommend starting a medication to help control blood pressure and prevent kidney and cardiac damages.  Starting amlodipine 5 mg daily.  Advised patient to monitor her blood pressure daily with a goal of 130/80 or less.  We will bring her back in 2 weeks for a nurse visit to see how she is doing on the new medication.  2. Empyema, left ([place]) Resolved.  Postsurgical follow-up with CT surgery as directed.  3. Acute kidney injury ([place]) Followed by nephrology.  Nearly full resolution but not quite at baseline.  Continue to avoid nephrotoxic medications.  Return in about 2 weeks (around 09/24/2020) for nurse visit for BP check . ___________________________________________ Clearnce Sorrel, DNP, APRN, FNP-BC [facility] and [place]

## 2020-09-23 ENCOUNTER — Ambulatory Visit (INDEPENDENT_AMBULATORY_CARE_PROVIDER_SITE_OTHER): Payer: Medicaid Other | Admitting: Medical-Surgical

## 2020-09-23 ENCOUNTER — Other Ambulatory Visit: Payer: Self-pay

## 2020-09-23 VITALS — BP 122/82 | HR 86 | Wt 227.0 lb

## 2020-09-23 DIAGNOSIS — I10 Essential (primary) hypertension: Secondary | ICD-10-CM | POA: Diagnosis not present

## 2020-09-23 NOTE — Progress Notes (Signed)
   Subjective:    Patient ID: Lindsay Hall, female    DOB: 1985-12-21, 35 y.o.   MRN: [id number]  HPI Patient presents today for a follow up blood pressure check. She was started on Amlodipine '5mg'$  on 09/10/20. Patient reports no side effects from medication.    Review of Systems     Objective:   Physical Exam        Assessment & Plan:  Patient advised to continue medication as prescribed.

## 2020-09-27 ENCOUNTER — Encounter: Payer: Self-pay | Admitting: Medical-Surgical

## 2020-09-29 ENCOUNTER — Other Ambulatory Visit: Payer: Self-pay

## 2020-09-29 ENCOUNTER — Encounter: Payer: Self-pay | Admitting: Internal Medicine

## 2020-09-29 ENCOUNTER — Ambulatory Visit (INDEPENDENT_AMBULATORY_CARE_PROVIDER_SITE_OTHER): Payer: Medicaid Other | Admitting: Internal Medicine

## 2020-09-29 DIAGNOSIS — N179 Acute kidney failure, unspecified: Secondary | ICD-10-CM | POA: Diagnosis not present

## 2020-09-29 DIAGNOSIS — J869 Pyothorax without fistula: Secondary | ICD-10-CM | POA: Diagnosis not present

## 2020-09-29 NOTE — Patient Instructions (Signed)
Thank you for coming to see me today. It was a pleasure seeing you.  To Do: Marland Kitchen Continue following up with your primary doctor and nephrology . Good luck getting back to work and with the possible move to [place]! . Follow up with me as needed  If you have any questions or concerns, please do not hesitate to call the office at [phone].  Take Care,   Jule Ser, DO

## 2020-09-29 NOTE — Assessment & Plan Note (Addendum)
Labs from 09/01/20 showed improvement in her acute kidney injury.  PLAN: . Continued follow up with PCP and nephrology . Avoid nephrotoxic medications

## 2020-09-29 NOTE — Progress Notes (Signed)
[facility] Infectious Disease  CHIEF COMPLAINT:    Follow up for empyema  SUBJECTIVE:    Lindsay Hall is a 35 y.o. female with PMHx as below who presents to the clinic for follow up of empyema.  Last visit with me was 09/01/20.   She has been doing well.  Not having any fevers, chills, GI symptoms.  She still has some discomfort at VATS site when sneezing or yawning.  No coughing or SOB.  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.  Please see A&P for the details of today's visit and status of the patient's medical problems.   Patient's Medications  New Prescriptions   No medications on file  Previous Medications   AMLODIPINE (NORVASC) 5 MG TABLET    Take 1 tablet (5 mg total) by mouth daily.   ASPIRIN-ACETAMINOPHEN-CAFFEINE (EXCEDRIN MIGRAINE) 250-250-65 MG TABLET    Take 1 tablet by mouth every 6 (six) hours as needed for headache.    FERROUS SULFATE (IRON SUPPLEMENT PO)    Take by mouth. Liquid, patient unsure of dose   MULTIPLE VITAMINS-MINERALS (CENTRUM WOMEN) TABS    Take 1 tablet by mouth.  Modified Medications   No medications on file  Discontinued Medications   No medications on file     Past Medical History:  Diagnosis Date  . Cervical dysplasia   . Hx of migraines     Family History  Problem Relation Age of Onset  . Hypertension Mother   . Migraines Mother   . Migraines Sister   . Hypertension Brother   . Migraines Brother   . Hypertension Maternal Grandmother     Social History   Socioeconomic History  . Marital status: Significant Other    Spouse name: Not on file  . Number of children: Not on file  . Years of education: Not on file  . Highest education level: Not on file  Occupational History  . Not on file  Tobacco Use  . Smoking status: Never Smoker  . Smokeless tobacco: Never Used  Vaping Use  . Vaping Use: Never used  Substance and Sexual Activity  . Alcohol use:  No  . Drug use: No  . Sexual activity: Yes    Birth control/protection: None  Other Topics Concern  . Not on file  Social History Narrative  . Not on file   Social Determinants of Health   Financial Resource Strain: Not on file  Food Insecurity: Not on file  Transportation Needs: Not on file  Physical Activity: Not on file  Stress: Not on file  Social Connections: Not on file  Intimate Partner Violence: Not on file    Allergies  Allergen Reactions  . Shellfish-Derived Products Other (See Comments)    No reaction listed    Review of Systems  Constitutional: Negative for chills and fever.  Respiratory: Negative for cough and shortness of breath.   Cardiovascular: Positive for chest pain.  Gastrointestinal: Negative.      OBJECTIVE:    Vitals:   09/29/20 0851  BP: 139/80  Pulse: 76  Temp: 97.9 F (36.6 C)  TempSrc: Oral  SpO2: 100%  Weight: 229 lb (103.9 kg)  Height: '5\' 2"'$  (1.575 m)   Body mass index is 41.88 kg/m.  Physical Exam Constitutional:      General: She is not in acute distress.    Appearance: Normal appearance.  Cardiovascular:  Rate and Rhythm: Normal rate and regular rhythm.     Heart sounds: No murmur heard.   Pulmonary:     Effort: Pulmonary effort is normal.     Breath sounds: Normal breath sounds.  Neurological:     General: No focal deficit present.     Mental Status: She is alert and oriented to person, place, and time.  Psychiatric:        Mood and Affect: Mood normal.        Behavior: Behavior normal.      Labs and Microbiology: CBC Latest Ref Rng & Units 09/01/2020 08/03/2020 08/02/2020  WBC 3.8 - 10.8 Thousand/uL 8.1 10.6(H) 10.3  Hemoglobin 11.7 - 15.5 g/dL 11.8 7.8(L) 7.5(L)  Hematocrit 35.0 - 45.0 % 34.9(L) 25.3(L) 25.3(L)  Platelets 140 - 400 Thousand/uL 338 409(H) 392   CMP Latest Ref Rng & Units 09/01/2020 08/03/2020 08/02/2020  Glucose 65 - 99 mg/dL 98 91 80  BUN 7 - 25 mg/dL '9 12 13  '$ Creatinine 0.50 -  1.10 mg/dL 1.44(H) 1.78(H) 2.03(H)  Sodium 135 - 146 mmol/L 139 139 138  Potassium 3.5 - 5.3 mmol/L 4.2 3.3(L) 3.3(L)  Chloride 98 - 110 mmol/L 108 110 110  CO2 20 - 32 mmol/L 23 17(L) 19(L)  Calcium 8.6 - 10.2 mg/dL 9.0 8.4(L) 8.4(L)  Total Protein 6.1 - 8.1 g/dL 6.5 - -  Total Bilirubin 0.2 - 1.2 mg/dL 0.2 - -  Alkaline Phos 38 - 126 U/L - - -  AST 10 - 30 U/L 11 - -  ALT 6 - 29 U/L 6 - -     No results found for this or any previous visit (from the past 240 hour(s)).  Imaging: CT Scan Chest 09/08/20: IMPRESSION: 1. There is considerably less pleural fluid and thickening in the left lower lobe compared to prior CT. Fairly small pleural effusion remains on the left with free-flowing and loculated components. Associated areas of atelectatic change. No air collection to suggest abscess or empyema. There is relatively mild atelectatic change in the inferior lingula and left lower lobe. Lungs elsewhere clear. No pneumothorax.  2.  No appreciable adenopathy.  3.  1 mm calculus mid left kidney.   ASSESSMENT & PLAN:    Empyema, left ([place]) She continues to do well after completing antibiotics on 09/02/20.  She did 5 weeks of cefdinir and metronidazole after cultures grew mixed anaerobic flora and Strep constellatus.  Follow up CT scan obtained and reviewed from 09/08/20 showed improvement from prior.  PLAN: . Continue to monitor off antibiotics . No further ID follow up needed at this time  Acute kidney injury ([place]) Labs from 09/01/20 showed improvement in her acute kidney injury.  PLAN: . Continued follow up with PCP and nephrology . Avoid nephrotoxic medications   [facility] 09/29/2020, 9:08 AM

## 2020-09-29 NOTE — Assessment & Plan Note (Signed)
She continues to do well after completing antibiotics on 09/02/20.  She did 5 weeks of cefdinir and metronidazole after cultures grew mixed anaerobic flora and Strep constellatus.  Follow up CT scan obtained and reviewed from 09/08/20 showed improvement from prior.  PLAN: . Continue to monitor off antibiotics . No further ID follow up needed at this time

## 2020-10-26 ENCOUNTER — Encounter: Payer: Self-pay | Admitting: Physician Assistant

## 2020-10-26 ENCOUNTER — Ambulatory Visit (INDEPENDENT_AMBULATORY_CARE_PROVIDER_SITE_OTHER): Payer: Medicaid Other | Admitting: Physician Assistant

## 2020-10-26 ENCOUNTER — Other Ambulatory Visit: Payer: Self-pay

## 2020-10-26 DIAGNOSIS — L659 Nonscarring hair loss, unspecified: Secondary | ICD-10-CM

## 2020-10-26 MED ORDER — FLUOCINOLONE ACETONIDE BODY 0.01 % EX OIL: 1.0000 "application " | TOPICAL_OIL | Freq: Two times a day (BID) | CUTANEOUS | 7 refills | Status: DC

## 2020-10-26 MED ORDER — TRIAMCINOLONE ACETONIDE 10 MG/ML IJ SUSP
10.0000 mg | Freq: Once | INTRAMUSCULAR | Status: AC
  Administered 2020-10-26: 10 mg

## 2020-10-26 MED ORDER — KETOCONAZOLE 2 % EX SHAM: 1.0000 "application " | MEDICATED_SHAMPOO | Freq: Once | CUTANEOUS | 0 refills | Status: AC

## 2020-10-26 NOTE — Progress Notes (Signed)
   New Patient   Subjective  Lindsay Hall is a 35 y.o. female who presents for the following: New Patient (Initial Visit) (Patient here today for folliculitis x 17 years patient was diagnosed by Dr. Leonie Green and another Doctor. Per patient the treatment Dr. Leonie Green gave didn't work.).   The following portions of the chart were reviewed this encounter and updated as appropriate:      Objective  Well appearing patient in no apparent distress; mood and affect are within normal limits.  A focused examination was performed including scalp. Relevant physical exam findings are noted in the Assessment and Plan.  Objective  posterior Scalp: Small dermal papules that are exquisitely tender. 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   Assessment & Plan  Alopecia posterior Scalp  Intralesional injections TAC '10mg'$ /cc  ketoconazole (NIZORAL) 2 % shampoo - posterior Scalp     I, Merrissa Giacobbe, PA-C, have reviewed all documentation for this visit. The documentation on 10/26/20 for the exam, diagnosis, procedures, and orders are all accurate and complete.

## 2020-10-28 ENCOUNTER — Encounter: Payer: Self-pay | Admitting: Medical-Surgical

## 2020-10-29 ENCOUNTER — Ambulatory Visit: Payer: Medicaid Other | Admitting: Certified Nurse Midwife

## 2020-10-29 ENCOUNTER — Encounter: Payer: Self-pay | Admitting: Certified Nurse Midwife

## 2020-10-29 ENCOUNTER — Other Ambulatory Visit: Payer: Self-pay

## 2020-10-29 VITALS — BP 145/85 | HR 65 | Ht 62.0 in | Wt 230.0 lb

## 2020-10-29 DIAGNOSIS — Z30017 Encounter for initial prescription of implantable subdermal contraceptive: Secondary | ICD-10-CM | POA: Diagnosis not present

## 2020-10-29 LAB — POCT URINE PREGNANCY: Preg Test, Ur: NEGATIVE

## 2020-10-29 MED ORDER — ETONOGESTREL 68 MG ~~LOC~~ IMPL
68.0000 mg | DRUG_IMPLANT | Freq: Once | SUBCUTANEOUS | Status: AC
Start: 2020-10-29 — End: 2020-10-29
  Administered 2020-10-29: 68 mg via SUBCUTANEOUS

## 2020-10-29 NOTE — Progress Notes (Signed)
   GYNECOLOGY OFFICE VISIT NOTE  History:  35 y.o. [id number] here today for Nexplanon insertion. She denies any abnormal vaginal discharge, bleeding, pelvic pain or other concerns. She is currently using condoms. Has not had unprotected IC since LMP.   Past Medical History:  Diagnosis Date  . Cervical dysplasia   . Hx of migraines     Past Surgical History:  Procedure Laterality Date  . CERVICAL ABLATION  2009  . DECORTICATION Left 07/28/2020   Procedure: DECORTICATION;  Surgeon: [place] Nakayama, MD;  Location: [facility];  Service: Thoracic;  Laterality: Left;  . LEEP    . 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 VIDEO ASSISTED THORACOSCOPY (VATS)/EMPYEMA Left 07/28/2020   Procedure: VIDEO ASSISTED THORACOSCOPY (VATS)/EMPYEMA;  Surgeon: Melrose Nakayama, MD;  Location: [facility];  Service: Thoracic;  Laterality: Left;    The following portions of the patient's history were reviewed and updated as appropriate: allergies, current medications, past family history, past medical history, past social history, past surgical history and problem list.   Review of Systems:  Negative except noted in HPI  Objective:  Physical Exam BP (!) 145/85   Pulse 65   Ht '5\' 2"'$  (1.575 m)   Wt 230 lb (104.3 kg)   LMP 10/18/2020   BMI 42.07 kg/m  CONSTITUTIONAL: Well-developed, well-nourished female in no acute distress.  HENT:  Normocephalic, atraumatic NECK: Normal range of motion SKIN: Skin is warm and dry NEUROLOGIC: Alert and oriented to person, place, and time PSYCHIATRIC: Normal mood and affect CARDIOVASCULAR: Normal heart rate noted RESPIRATORY: Effort and rate normal MUSCULOSKELETAL: Normal range of motion  Labs and Imaging Results for orders placed or performed in visit on 10/29/20 (from the past 24 hour(s))  POCT urine pregnancy     Status: Normal    Collection Time: 10/29/20 10:35 AM  Result Value Ref Range   Preg Test, Ur Negative Negative   GYNECOLOGY PROCEDURE NOTE  Lindsay Hall is a 35 y.o. G4P3010 here for Nexplanon insertion. No other gynecologic concerns.  Nexplanon Insertion Procedure Patient identified, informed consent performed, consent signed.  Patient does understand that irregular bleeding is a very common side effect of this medication. She was advised to have backup contraception for one week after placement. Pregnancy test in clinic today was negative.  Appropriate time out taken.  Patient's left arm was prepped and draped in the usual sterile fashion. The insertion area was measured and marked.  Patient was prepped with alcohol swab and then injected with 3 ml of 1% lidocaine.  The area was then prepped with betadine, Nexplanon removed from packaging,  device confirmed present within needle, then inserted full length of needle and withdrawn per handbook instructions. Nexplanon was able to palpated in the patient's arm; patient palpated the insert herself. There was minimal blood loss.  Patient insertion site covered with guaze and a pressure bandage to reduce any bruising.  The patient tolerated the procedure well and was given post procedure instructions.   Assessment & Plan:   1. Nexplanon insertion    Follow up yearly or prn   Julianne Handler, [place] 10/29/2020 10:45 AM

## 2020-10-29 NOTE — Progress Notes (Signed)
UPT-Neg

## 2020-11-10 ENCOUNTER — Encounter: Payer: Self-pay | Admitting: Medical-Surgical

## 2020-11-10 ENCOUNTER — Ambulatory Visit (INDEPENDENT_AMBULATORY_CARE_PROVIDER_SITE_OTHER): Payer: Medicaid Other | Admitting: Medical-Surgical

## 2020-11-10 ENCOUNTER — Other Ambulatory Visit: Payer: Self-pay

## 2020-11-10 ENCOUNTER — Encounter: Payer: Self-pay | Admitting: Physician Assistant

## 2020-11-10 VITALS — BP 153/86 | HR 78 | Temp 98.5°F | Ht 63.75 in | Wt 233.6 lb

## 2020-11-10 DIAGNOSIS — Z1329 Encounter for screening for other suspected endocrine disorder: Secondary | ICD-10-CM

## 2020-11-10 DIAGNOSIS — E7800 Pure hypercholesterolemia, unspecified: Secondary | ICD-10-CM | POA: Diagnosis not present

## 2020-11-10 DIAGNOSIS — I150 Renovascular hypertension: Secondary | ICD-10-CM | POA: Diagnosis not present

## 2020-11-10 DIAGNOSIS — R7303 Prediabetes: Secondary | ICD-10-CM | POA: Diagnosis not present

## 2020-11-10 DIAGNOSIS — Z0000 Encounter for general adult medical examination without abnormal findings: Secondary | ICD-10-CM

## 2020-11-10 MED ORDER — AMLODIPINE BESYLATE 10 MG PO TABS: 10.0000 mg | ORAL_TABLET | Freq: Every day | ORAL | 1 refills | Status: DC

## 2020-11-10 NOTE — Progress Notes (Signed)
HPI: Lindsay Hall is a 35 y.o. female who  has a past medical history of Cervical dysplasia and migraines.  she presents to [facility] today, 11/10/20,  for chief complaint of: Annual physical exam  Dentist: last visit October 2021, due for cleaning Eye exam: unsure, overdue Exercise: not as much as she'd like, does walk stairs periodically Diet: eating a lot more veggies than she used to, limiting portions, trying to make healthier choices, not a breakfast person Pap smear: UTD Mammogram: Not due Colon cancer screening: Not due COVID vaccine: None  Concerns: BP elevated recently at home with SBP in 150s. Taking Amlodipine 81m daily as prescribed.   Past medical, surgical, social and family history reviewed:  Patient Active Problem List   Diagnosis Date Noted  . Empyema, left ([place] 07/27/2020  . Constipation 07/02/2020  . [place] ([place] 06/30/2020  . Loculated pleural effusion 06/30/2020  . 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 Sepsis with acute renal failure without septic shock ([place] 06/30/2020  . Class 3 obesity without serious comorbidity with body mass index (BMI) of 40.0 to 44.9 in adult 09/25/2016  . Prediabetes 09/25/2016  . Pneumonia of left lung due to infectious organism 06/28/2011  . Vaginal Pap smear with HGSIL 06/28/2011    Past Surgical History:  Procedure Laterality Date  . CERVICAL ABLATION  2009  . DECORTICATION Left 07/28/2020   Procedure: DECORTICATION;  Surgeon: HMelrose Nakayama MD;  [place]  Service: Thoracic;  Laterality: Left;  . LEEP    . 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 VIDEO ASSISTED THORACOSCOPY (VATS)/EMPYEMA Left 07/28/2020   Procedure: VIDEO ASSISTED THORACOSCOPY (VATS)/EMPYEMA;  Surgeon: HMelrose Nakayama MD;  [facility]  Service:  Thoracic;  Laterality: Left;    Social History   Tobacco Use  . Smoking status: Never Smoker  . Smokeless tobacco: Never Used  Substance Use Topics  . Alcohol use: No    Family History  Problem Relation Age of Onset  . Hypertension Mother   . Migraines Mother   . Migraines Sister   . Hypertension Brother   . Migraines Brother   . Hypertension Maternal Grandmother      Current medication list and allergy/intolerance information reviewed:    Current Outpatient Medications  Medication Sig Dispense Refill  . aspirin-acetaminophen-caffeine (EXCEDRIN MIGRAINE) 250-250-65 MG tablet Take 1 tablet by mouth every 6 (six) hours as needed for headache.     . Ferrous Sulfate (IRON SUPPLEMENT PO) Take by mouth. Liquid, patient unsure of dose    . Fluocinolone Acetonide Body 0.01 % OIL Apply 1 application topically in the morning and at bedtime. 120 mL 7  . Multiple Vitamins-Minerals (CENTRUM WOMEN) TABS Take 1 tablet by mouth.    .Marland KitchenamLODipine (NORVASC) 10 MG tablet Take 1 tablet (10 mg total) by mouth daily. 90 tablet 1   No current facility-administered medications for this visit.    Allergies  Allergen Reactions  . Shellfish-Derived Products Other (See Comments)    No reaction listed   Review of Systems:  Constitutional:  No  fever, no chills, No recent illness, No unintentional weight changes. No significant fatigue.   HEENT: + headache, no vision change, no hearing change, No sore throat, No  sinus pressure  Cardiac: No  chest pain, No  pressure, No palpitations, No  Orthopnea  Respiratory:  +  intermittent shortness of breath with rest and exertion. No  Cough  Gastrointestinal: No  abdominal pain, No  nausea, No  vomiting,  No  blood in stool, No  diarrhea, No  constipation   Musculoskeletal: No new myalgia/arthralgia  Skin: No Rash, No other wounds/concerning lesions  Genitourinary: No  incontinence, No  abnormal genital bleeding, No abnormal genital  discharge  Hem/Onc: No  easy bruising/bleeding, No  abnormal lymph node  Endocrine: No cold intolerance,  No heat intolerance. No polyuria/polydipsia/polyphagia   Neurologic: No  weakness, No  dizziness, No  slurred speech/focal weakness/facial droop  Psychiatric: No  concerns with depression, No  concerns with anxiety, No sleep problems, No mood problems  Exam:  BP (!) 153/86   Pulse 78   Temp 98.5 F (36.9 C)   Ht 5' 3.75" (1.619 m)   Wt 233 lb 9.6 oz (106 kg)   LMP 11/10/2020   SpO2 100%   BMI 40.41 kg/m   Constitutional: VS see above. General Appearance: alert, well-developed, well-nourished, NAD  Eyes: Normal lids and conjunctive, non-icteric sclera  Ears, Nose, Mouth, Throat: MMM, Normal external inspection ears/nares/mouth/lips/gums. TM normal bilaterally.   Neck: No masses, trachea midline. No thyroid enlargement. No tenderness/mass appreciated. No lymphadenopathy  Respiratory: Normal respiratory effort. no wheeze, no rhonchi, no rales  Cardiovascular: S1/S2 normal, no murmur, no rub/gallop auscultated. RRR. No lower extremity edema. Pedal pulse II/IV bilaterally PT. No carotid bruit or JVD. No abdominal aortic bruit.  Gastrointestinal: Nontender, no masses. No hepatomegaly, no splenomegaly. No hernia appreciated. Bowel sounds normal. Rectal exam deferred.   Musculoskeletal: Gait normal. No clubbing/cyanosis of digits.   Neurological: Normal balance/coordination. No tremor. No cranial nerve deficit on limited exam. Motor and sensation intact and symmetric. Cerebellar reflexes intact.   Skin: warm, dry, intact. No rash/ulcer. No concerning nevi or subq nodules on limited exam.    Psychiatric: Normal judgment/insight. Normal mood and affect. Oriented x3.   No results found for this or any previous visit (from the past 72 hour(s)).  No results found.  ASSESSMENT/PLAN:   1. Annual physical exam Checking CBC, CMP, and Lipid panel. UTD on preventative care.   2.  Renovascular hypertension Increasing Amlodipine to 28m daily. Try this dose for a week, if no swelling of the lower extremities, continue this dose. Monitor home BP with a goal of 130/80 or less. If you experience swelling, call the office. Will plan to start Coreg BID if higher dose of Amlodipine.   3. Prediabetes Checking Hemoglobin A1c.   4. Screening for endocrine disorder Checking TSH.   Orders Placed This Encounter  Procedures  . CBC with Differential/Platelet  . COMPLETE METABOLIC PANEL WITH GFR  . Lipid panel  . TSH  . Hemoglobin A1c   Meds ordered this encounter  Medications  . amLODipine (NORVASC) 10 MG tablet    Sig: Take 1 tablet (10 mg total) by mouth daily.    Dispense:  90 tablet    Refill:  1    Order Specific Question:   Supervising Provider    Answer:   AEmeterio Reeve[[[id number]   Patient Instructions   Preventive Care 220372Years Old, Female Preventive care refers to lifestyle choices and visits with your health care provider that can promote health and wellness. This includes:  A yearly physical exam. This is also called an annual wellness visit.  Regular dental and eye exams.  Immunizations.  Screening for certain conditions.  Healthy lifestyle choices, such as: ? Eating a  healthy diet. ? Getting regular exercise. ? Not using drugs or products that contain nicotine and tobacco. ? Limiting alcohol use. What can I expect for my preventive care visit? Physical exam Your health care provider may check your:  Height and weight. These may be used to calculate your BMI (body mass index). BMI is a measurement that tells if you are at a healthy weight.  Heart rate and blood pressure.  Body temperature.  Skin for abnormal spots. Counseling Your health care provider may ask you questions about your:  Past medical problems.  Family's medical history.  Alcohol, tobacco, and drug use.  Emotional well-being.  Home life and relationship  well-being.  Sexual activity.  Diet, exercise, and sleep habits.  Work and work Statistician.  Access to firearms.  Method of birth control.  Menstrual cycle.  Pregnancy history. What immunizations do I need? Vaccines are usually given at various ages, according to a schedule. Your health care provider will recommend vaccines for you based on your age, medical history, and lifestyle or other factors, such as travel or where you work.   What tests do I need? Blood tests  Lipid and cholesterol levels. These may be checked every 5 years starting at age 72.  Hepatitis C test.  Hepatitis B test. Screening  Diabetes screening. This is done by checking your blood sugar (glucose) after you have not eaten for a while (fasting).  STD (sexually transmitted disease) testing, if you are at risk.  BRCA-related cancer screening. This may be done if you have a family history of breast, ovarian, tubal, or peritoneal cancers.  Pelvic exam and Pap test. This may be done every 3 years starting at age 3. Starting at age 74, this may be done every 5 years if you have a Pap test in combination with an HPV test. Talk with your health care provider about your test results, treatment options, and if necessary, the need for more tests.   Follow these instructions at home: Eating and drinking  Eat a healthy diet that includes fresh fruits and vegetables, whole grains, lean protein, and low-fat dairy products.  Take vitamin and mineral supplements as recommended by your health care provider.  Do not drink alcohol if: ? Your health care provider tells you not to drink. ? You are pregnant, may be pregnant, or are planning to become pregnant.  If you drink alcohol: ? Limit how much you have to 0-1 drink a day. ? Be aware of how much alcohol is in your drink. In the [place] one drink equals one 12 oz bottle of beer (355 mL), one 5 oz glass of wine (148 mL), or one 1 oz glass of hard liquor (44 mL).    Lifestyle  Take daily care of your teeth and gums. Brush your teeth every morning and night with fluoride toothpaste. Floss one time each day.  Stay active. Exercise for at least 30 minutes 5 or more days each week.  Do not use any products that contain nicotine or tobacco, such as cigarettes, e-cigarettes, and chewing tobacco. If you need help quitting, ask your health care provider.  Do not use drugs.  If you are sexually active, practice safe sex. Use a condom or other form of protection to prevent STIs (sexually transmitted infections).  If you do not wish to become pregnant, use a form of birth control. If you plan to become pregnant, see your health care provider for a prepregnancy visit.  Find healthy ways to cope  with stress, such as: ? Meditation, yoga, or listening to music. ? Journaling. ? Talking to a trusted person. ? Spending time with friends and family. Safety  Always wear your seat belt while driving or riding in a vehicle.  Do not drive: ? If you have been drinking alcohol. Do not ride with someone who has been drinking. ? When you are tired or distracted. ? While texting.  Wear a helmet and other protective equipment during sports activities.  If you have firearms in your house, make sure you follow all gun safety procedures.  Seek help if you have been physically or sexually abused. What's next?  Go to your health care provider once a year for an annual wellness visit.  Ask your health care provider how often you should have your eyes and teeth checked.  Stay up to date on all vaccines. This information is not intended to replace advice given to you by your health care provider. Make sure you discuss any questions you have with your health care provider. Document Revised: 04/18/2020 Document Reviewed: 05/02/2018 Elsevier Patient Education  2021 [place].  Follow-up plan: Return in about 2 weeks (around 11/24/2020) for nurse visit for BP  check.  Clearnce Sorrel, DNP, APRN, FNP-BC [facility]

## 2020-11-10 NOTE — Patient Instructions (Signed)
Preventive Care 21-37 Years Old, Female Preventive care refers to lifestyle choices and visits with your health care provider that can promote health and wellness. This includes:  A yearly physical exam. This is also called an annual wellness visit.  Regular dental and eye exams.  Immunizations.  Screening for certain conditions.  Healthy lifestyle choices, such as: ? Eating a healthy diet. ? Getting regular exercise. ? Not using drugs or products that contain nicotine and tobacco. ? Limiting alcohol use. What can I expect for my preventive care visit? Physical exam Your health care provider may check your:  Height and weight. These may be used to calculate your BMI (body mass index). BMI is a measurement that tells if you are at a healthy weight.  Heart rate and blood pressure.  Body temperature.  Skin for abnormal spots. Counseling Your health care provider may ask you questions about your:  Past medical problems.  Family's medical history.  Alcohol, tobacco, and drug use.  Emotional well-being.  Home life and relationship well-being.  Sexual activity.  Diet, exercise, and sleep habits.  Work and work environment.  Access to firearms.  Method of birth control.  Menstrual cycle.  Pregnancy history. What immunizations do I need? Vaccines are usually given at various ages, according to a schedule. Your health care provider will recommend vaccines for you based on your age, medical history, and lifestyle or other factors, such as travel or where you work.   What tests do I need? Blood tests  Lipid and cholesterol levels. These may be checked every 5 years starting at age 20.  Hepatitis C test.  Hepatitis B test. Screening  Diabetes screening. This is done by checking your blood sugar (glucose) after you have not eaten for a while (fasting).  STD (sexually transmitted disease) testing, if you are at risk.  BRCA-related cancer screening. This may be  done if you have a family history of breast, ovarian, tubal, or peritoneal cancers.  Pelvic exam and Pap test. This may be done every 3 years starting at age 21. Starting at age 30, this may be done every 5 years if you have a Pap test in combination with an HPV test. Talk with your health care provider about your test results, treatment options, and if necessary, the need for more tests.   Follow these instructions at home: Eating and drinking  Eat a healthy diet that includes fresh fruits and vegetables, whole grains, lean protein, and low-fat dairy products.  Take vitamin and mineral supplements as recommended by your health care provider.  Do not drink alcohol if: ? Your health care provider tells you not to drink. ? You are pregnant, may be pregnant, or are planning to become pregnant.  If you drink alcohol: ? Limit how much you have to 0-1 drink a day. ? Be aware of how much alcohol is in your drink. In the [place] one drink equals one 12 oz bottle of beer (355 mL), one 5 oz glass of wine (148 mL), or one 1 oz glass of hard liquor (44 mL).   Lifestyle  Take daily care of your teeth and gums. Brush your teeth every morning and night with fluoride toothpaste. Floss one time each day.  Stay active. Exercise for at least 30 minutes 5 or more days each week.  Do not use any products that contain nicotine or tobacco, such as cigarettes, e-cigarettes, and chewing tobacco. If you need help quitting, ask your health care provider.  Do not   use drugs.  If you are sexually active, practice safe sex. Use a condom or other form of protection to prevent STIs (sexually transmitted infections).  If you do not wish to become pregnant, use a form of birth control. If you plan to become pregnant, see your health care provider for a prepregnancy visit.  Find healthy ways to cope with stress, such as: ? Meditation, yoga, or listening to music. ? Journaling. ? Talking to a trusted  person. ? Spending time with friends and family. Safety  Always wear your seat belt while driving or riding in a vehicle.  Do not drive: ? If you have been drinking alcohol. Do not ride with someone who has been drinking. ? When you are tired or distracted. ? While texting.  Wear a helmet and other protective equipment during sports activities.  If you have firearms in your house, make sure you follow all gun safety procedures.  Seek help if you have been physically or sexually abused. What's next?  Go to your health care provider once a year for an annual wellness visit.  Ask your health care provider how often you should have your eyes and teeth checked.  Stay up to date on all vaccines. This information is not intended to replace advice given to you by your health care provider. Make sure you discuss any questions you have with your health care provider. Document Revised: 04/18/2020 Document Reviewed: 05/02/2018 Elsevier Patient Education  2021 Elsevier Inc.  

## 2020-11-11 LAB — COMPLETE METABOLIC PANEL WITH GFR
AG Ratio: 1 (calc) (ref 1.0–2.5)
ALT: 9 U/L (ref 6–29)
AST: 9 U/L — ABNORMAL LOW (ref 10–30)
Albumin: 3.3 g/dL — ABNORMAL LOW (ref 3.6–5.1)
Alkaline phosphatase (APISO): 76 U/L (ref 31–125)
BUN/Creatinine Ratio: 10 (calc) (ref 6–22)
BUN: 17 mg/dL (ref 7–25)
CO2: 21 mmol/L (ref 20–32)
Calcium: 8.8 mg/dL (ref 8.6–10.2)
Chloride: 109 mmol/L (ref 98–110)
Creat: 1.75 mg/dL — ABNORMAL HIGH (ref 0.50–1.10)
GFR, Est African American: 43 mL/min/{1.73_m2} — ABNORMAL LOW (ref >=60)
GFR, Est Non African American: 37 mL/min/{1.73_m2} — ABNORMAL LOW (ref >=60)
Globulin: 3.3 g/dL (calc) (ref 1.9–3.7)
Glucose, Bld: 77 mg/dL (ref 65–99)
Potassium: 4 mmol/L (ref 3.5–5.3)
Sodium: 141 mmol/L (ref 135–146)
Total Bilirubin: 0.2 mg/dL (ref 0.2–1.2)
Total Protein: 6.6 g/dL (ref 6.1–8.1)

## 2020-11-11 LAB — CBC WITH DIFFERENTIAL/PLATELET
Absolute Monocytes: 400 cells/uL (ref 200–950)
Basophils Absolute: 40 cells/uL (ref 0–200)
Basophils Relative: 0.5 %
Eosinophils Absolute: 336 cells/uL (ref 15–500)
Eosinophils Relative: 4.2 %
HCT: 37.2 % (ref 35.0–45.0)
Hemoglobin: 12.6 g/dL (ref 11.7–15.5)
Lymphs Abs: 2200 cells/uL (ref 850–3900)
MCH: 30.1 pg (ref 27.0–33.0)
MCHC: 33.9 g/dL (ref 32.0–36.0)
MCV: 88.8 fL (ref 80.0–100.0)
MPV: 12 fL (ref 7.5–12.5)
Monocytes Relative: 5 %
Neutro Abs: 5024 cells/uL (ref 1500–7800)
Neutrophils Relative %: 62.8 %
Platelets: 267 10*3/uL (ref 140–400)
RBC: 4.19 10*6/uL (ref 3.80–5.10)
RDW: 12 % (ref 11.0–15.0)
Total Lymphocyte: 27.5 %
WBC: 8 10*3/uL (ref 3.8–10.8)

## 2020-11-11 LAB — TSH: TSH: 2 mIU/L

## 2020-11-11 LAB — LIPID PANEL
Cholesterol: 230 mg/dL — ABNORMAL HIGH (ref <200)
HDL: 50 mg/dL (ref >=50)
LDL Cholesterol (Calc): 164 mg/dL (calc) — ABNORMAL HIGH
Non-HDL Cholesterol (Calc): 180 mg/dL (calc) — ABNORMAL HIGH (ref <130)
Total CHOL/HDL Ratio: 4.6 (calc) (ref <5.0)
Triglycerides: 64 mg/dL (ref <150)

## 2020-11-11 LAB — HEMOGLOBIN A1C
Hgb A1c MFr Bld: 5.3 % of total Hgb (ref <5.7)
Mean Plasma Glucose: 105 mg/dL
eAG (mmol/L): 5.8 mmol/L

## 2020-11-24 ENCOUNTER — Ambulatory Visit (INDEPENDENT_AMBULATORY_CARE_PROVIDER_SITE_OTHER): Payer: Medicaid Other | Admitting: Medical-Surgical

## 2020-11-24 ENCOUNTER — Other Ambulatory Visit: Payer: Self-pay

## 2020-11-24 VITALS — BP 135/82 | HR 81 | Resp 20

## 2020-11-24 DIAGNOSIS — I150 Renovascular hypertension: Secondary | ICD-10-CM

## 2020-11-24 NOTE — Progress Notes (Signed)
Established Patient Office Visit  Subjective:  Patient ID: Lindsay Hall, female    DOB: 1986-03-05  Age: 35 y.o. MRN: [id number]  CC:  Chief Complaint  Patient presents with  . Hypertension    HPI Lindsay Hall presents for a BP check. BP today 135/82.  Past Medical History:  Diagnosis Date  . Cervical dysplasia   . Hx of migraines     Past Surgical History:  Procedure Laterality Date  . CERVICAL ABLATION  2009  . DECORTICATION Left 07/28/2020   Procedure: DECORTICATION;  Surgeon: Melrose Nakayama, MD;  Location: [place];  Service: Thoracic;  Laterality: Left;  . LEEP    . 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 VIDEO ASSISTED THORACOSCOPY (VATS)/EMPYEMA Left 07/28/2020   Procedure: VIDEO ASSISTED THORACOSCOPY (VATS)/EMPYEMA;  Surgeon: Melrose Nakayama, MD;  [facility];  Service: Thoracic;  Laterality: Left;    Family History  Problem Relation Age of Onset  . Hypertension Mother   . Migraines Mother   . Migraines Sister   . Hypertension Brother   . Migraines Brother   . Hypertension Maternal Grandmother     Social History   Socioeconomic History  . Marital status: Significant Other    Spouse name: Not on file  . Number of children: Not on file  . Years of education: Not on file  . Highest education level: Not on file  Occupational History  . Not on file  Tobacco Use  . Smoking status: Never Smoker  . Smokeless tobacco: Never Used  Vaping Use  . Vaping Use: Never used  Substance and Sexual Activity  . Alcohol use: No  . Drug use: No  . Sexual activity: Yes    Birth control/protection: None  Other Topics Concern  . Not on file  Social History Narrative  . Not on file   Social Determinants of Health   Financial Resource Strain: Not on file  Food Insecurity: Not on file  Transportation Needs: Not on file   Physical Activity: Not on file  Stress: Not on file  Social Connections: Not on file  Intimate Partner Violence: Not on file    Outpatient Medications Prior to Visit  Medication Sig Dispense Refill  . amLODipine (NORVASC) 10 MG tablet Take 1 tablet (10 mg total) by mouth daily. 90 tablet 1  . aspirin-acetaminophen-caffeine (EXCEDRIN MIGRAINE) 250-250-65 MG tablet Take 1 tablet by mouth every 6 (six) hours as needed for headache.     . Ferrous Sulfate (IRON SUPPLEMENT PO) Take by mouth. Liquid, patient unsure of dose    . Fluocinolone Acetonide Body 0.01 % OIL Apply 1 application topically in the morning and at bedtime. 120 mL 7  . Multiple Vitamins-Minerals (CENTRUM WOMEN) TABS Take 1 tablet by mouth.     No facility-administered medications prior to visit.    Allergies  Allergen Reactions  . Shellfish-Derived Products Other (See Comments)    No reaction listed    ROS Review of Systems    Objective:    Physical Exam  BP 135/82 (BP Location: Left Arm, Patient Position: Sitting, Cuff Size: Normal)   Pulse 81   Resp 20   LMP 11/10/2020   SpO2 100%  Wt Readings from Last 3 Encounters:  11/10/20 233 lb 9.6 oz (106 kg)  10/29/20 230 lb (104.3 kg)  09/29/20 229 lb (103.9 kg)  There are no preventive care reminders to display for this patient.  There are no preventive care reminders to display for this patient.  Lab Results  Component Value Date   TSH 2.00 11/10/2020   Lab Results  Component Value Date   WBC 8.0 11/10/2020   HGB 12.6 11/10/2020   HCT 37.2 11/10/2020   MCV 88.8 11/10/2020   PLT 267 11/10/2020   Lab Results  Component Value Date   NA 141 11/10/2020   K 4.0 11/10/2020   CO2 21 11/10/2020   GLUCOSE 77 11/10/2020   BUN 17 11/10/2020   CREATININE 1.75 (H) 11/10/2020   BILITOT 0.2 11/10/2020   ALKPHOS 67 07/30/2020   AST 9 (L) 11/10/2020   ALT 9 11/10/2020   PROT 6.6 11/10/2020   ALBUMIN 1.3 (L) 07/30/2020   CALCIUM 8.8 11/10/2020    ANIONGAP 12 08/03/2020   Lab Results  Component Value Date   CHOL 230 (H) 11/10/2020   Lab Results  Component Value Date   HDL 50 11/10/2020   Lab Results  Component Value Date   LDLCALC 164 (H) 11/10/2020   Lab Results  Component Value Date   TRIG 64 11/10/2020   Lab Results  Component Value Date   CHOLHDL 4.6 11/10/2020   Lab Results  Component Value Date   HGBA1C 5.3 11/10/2020      Assessment & Plan:  BP within normal limits. Continue with plan.  Problem List Items Addressed This Visit   None     No orders of the defined types were placed in this encounter.   Follow-up: No follow-ups on file.    Ninfa Meeker, CMA

## 2020-11-24 NOTE — Progress Notes (Signed)
Continue amlodipine 10 mg daily.  Plan to follow-up in 3 months for hypertension.  Clearnce Sorrel, DNP, APRN, FNP-BC [facility]

## 2020-12-08 ENCOUNTER — Ambulatory Visit: Payer: Medicaid Other | Admitting: Physician Assistant

## 2020-12-08 ENCOUNTER — Other Ambulatory Visit: Payer: Self-pay

## 2020-12-08 ENCOUNTER — Ambulatory Visit (INDEPENDENT_AMBULATORY_CARE_PROVIDER_SITE_OTHER): Payer: Medicaid Other | Admitting: Physician Assistant

## 2020-12-08 ENCOUNTER — Encounter: Payer: Self-pay | Admitting: Physician Assistant

## 2020-12-08 DIAGNOSIS — L659 Nonscarring hair loss, unspecified: Secondary | ICD-10-CM

## 2020-12-08 DIAGNOSIS — L219 Seborrheic dermatitis, unspecified: Secondary | ICD-10-CM

## 2020-12-08 MED ORDER — CICLOPIROX OLAMINE 0.77 % EX SUSP: 1.0000 "application " | Freq: Every day | CUTANEOUS | 11 refills | Status: DC

## 2020-12-08 MED ORDER — FLUOCINOLONE ACETONIDE BODY 0.01 % EX OIL: 1.0000 "application " | TOPICAL_OIL | Freq: Two times a day (BID) | CUTANEOUS | 11 refills | Status: DC

## 2021-01-05 ENCOUNTER — Encounter: Payer: Self-pay | Admitting: Physician Assistant

## 2021-01-05 NOTE — Progress Notes (Signed)
   Follow-Up Visit   Subjective  Lindsay Hall is a 35 y.o. female who presents for the following: Alopecia (Here for follow up on scalp. Last office visit we injected with IL Tac- per patient seemed to help a lot.  Also using ketoconazole shampoo and fluocinonide oil- which seems to be helping as well ).   The following portions of the chart were reviewed this encounter and updated as appropriate:      Objective  Well appearing patient in no apparent distress; mood and affect are within normal limits.  A focused examination was performed including scalp. Relevant physical exam findings are noted in the Assessment and Plan.  Objective  Scalp: Diffuse hair loss  Objective  Scalp: Much improved with no scale today.   Assessment & Plan  Alopecia Scalp  Continue nizoral shampoo, biotin and fluocinolone oil.  Seborrheic dermatitis Scalp  ciclopirox (LOPROX) 0.77 % SUSP - Scalp  Fluocinolone Acetonide Body 0.01 % OIL - Scalp    I, Marshun Duva, PA-C, have reviewed all documentation's for this visit.  The documentation on 01/05/21 for the exam, diagnosis, procedures and orders are all accurate and complete.

## 2021-02-16 DIAGNOSIS — N179 Acute kidney failure, unspecified: Secondary | ICD-10-CM | POA: Diagnosis not present

## 2021-02-24 ENCOUNTER — Ambulatory Visit (INDEPENDENT_AMBULATORY_CARE_PROVIDER_SITE_OTHER): Payer: Medicaid Other | Admitting: Medical-Surgical

## 2021-02-24 DIAGNOSIS — Z5329 Procedure and treatment not carried out because of patient's decision for other reasons: Secondary | ICD-10-CM

## 2021-02-25 NOTE — Progress Notes (Signed)
   Complete physical exam  Patient: Lindsay Hall   DOB: 06/24/2001   35 y.o. Female  MRN: [id number]  Subjective:    No chief complaint on file.   Lindsay Hall is a 35 y.o. female who presents today for a complete physical exam. She reports consuming a {diet types:17450} diet. {types:19826} She generally feels {DESC; WELL/FAIRLY WELL/POORLY:18703}. She reports sleeping {DESC; WELL/FAIRLY WELL/POORLY:18703}. She {does/does not:200015} have additional problems to discuss today.    Most recent fall risk assessment:    03/01/2022   10:42 AM  Fall Risk   Falls in the past year? 0  Number falls in past yr: 0  Injury with Fall? 0  Risk for fall due to : No Fall Risks  Follow up Falls evaluation completed     Most recent depression screenings:    03/01/2022   10:42 AM 01/20/2021   10:46 AM  PHQ 2/9 Scores  PHQ - 2 Score 0 0  PHQ- 9 Score 5     {VISON DENTAL STD PSA (Optional):27386}  {History (Optional):23778}  Patient Care Team: Adriahna Shearman, NP as PCP - General (Nurse Practitioner)   Outpatient Medications Prior to Visit  Medication Sig   fluticasone (FLONASE) 50 MCG/ACT nasal spray Place 2 sprays into both nostrils in the morning and at bedtime. After 7 days, reduce to once daily.   norgestimate-ethinyl estradiol (SPRINTEC 28) 0.25-35 MG-MCG tablet Take 1 tablet by mouth daily.   Nystatin POWD Apply liberally to affected area 2 times per day   spironolactone (ALDACTONE) 100 MG tablet Take 1 tablet (100 mg total) by mouth daily.   No facility-administered medications prior to visit.    ROS        Objective:     There were no vitals taken for this visit. {Vitals History (Optional):23777}  Physical Exam   No results found for any visits on 04/06/22. {Show previous labs (optional):23779}    Assessment & Plan:    Routine Health Maintenance and Physical Exam  Immunization History  Administered Date(s) Administered   DTaP 09/07/1999, 11/03/1999,  01/12/2000, 09/27/2000, 04/12/2004   Hepatitis A 02/07/2008, 02/12/2009   Hepatitis B 06/25/1999, 08/02/1999, 01/12/2000   HiB (PRP-OMP) 09/07/1999, 11/03/1999, 01/12/2000, 09/27/2000   IPV 09/07/1999, 11/03/1999, 07/02/2000, 04/12/2004   Influenza,inj,Quad PF,6+ Mos 05/15/2014   Influenza-Unspecified 08/14/2012   MMR 07/02/2001, 04/12/2004   Meningococcal Polysaccharide 02/12/2012   Pneumococcal Conjugate-13 09/27/2000   Pneumococcal-Unspecified 01/12/2000, 03/27/2000   Tdap 02/12/2012   Varicella 07/02/2000, 02/07/2008    Health Maintenance  Topic Date Due   HIV Screening  Never done   Hepatitis C Screening  Never done   INFLUENZA VACCINE  04/04/2022   PAP-Cervical Cytology Screening  04/06/2022 (Originally 06/23/2020)   PAP SMEAR-Modifier  04/06/2022 (Originally 06/23/2020)   TETANUS/TDAP  04/06/2022 (Originally 02/11/2022)   HPV VACCINES  Discontinued   COVID-19 Vaccine  Discontinued    Discussed health benefits of physical activity, and encouraged her to engage in regular exercise appropriate for her age and condition.  Problem List Items Addressed This Visit   None Visit Diagnoses     Annual physical exam    -  Primary   Cervical cancer screening       Need for Tdap vaccination          No follow-ups on file.     Geniene List, NP   

## 2021-02-28 ENCOUNTER — Encounter: Payer: Self-pay | Admitting: Medical-Surgical

## 2021-02-28 ENCOUNTER — Ambulatory Visit (INDEPENDENT_AMBULATORY_CARE_PROVIDER_SITE_OTHER): Payer: Medicaid Other | Admitting: Medical-Surgical

## 2021-02-28 ENCOUNTER — Other Ambulatory Visit: Payer: Self-pay

## 2021-02-28 VITALS — BP 145/87 | HR 60 | Temp 99.3°F | Ht 63.75 in | Wt 223.3 lb

## 2021-02-28 DIAGNOSIS — I150 Renovascular hypertension: Secondary | ICD-10-CM

## 2021-02-28 DIAGNOSIS — Z713 Dietary counseling and surveillance: Secondary | ICD-10-CM | POA: Diagnosis not present

## 2021-02-28 DIAGNOSIS — I10 Essential (primary) hypertension: Secondary | ICD-10-CM

## 2021-02-28 NOTE — Progress Notes (Signed)
Subjective:    CC: Hypertension follow-up  HPI: Pleasant 35 year old female presenting today for hypertension follow-up.  She stopped taking amlodipine 10 mg daily due to her changing shift and forgotten doses.  She has been monitoring her blood pressure at home and work with readings in the 130s/80s.  Has followed up with nephrology in the last few weeks and notes that her kidney function is slightly worse with a GFR of 37, down from 59.  She is very worried about this and somewhat discouraged as she is worked hard recently to maintain her hydration status and avoid nephrotoxic medications.  She has been working diligently on her diet and trying to get exercise in regularly.  She notes that she has lost from 230 down to 219 at her lowest but has gained a few pounds back and is now hovering at 223.  She has been making efforts to stay active and is currently working as a travel Marine scientist.  She has been doing intermittent fasting for several months but is not currently counting macros or calories.  I reviewed the past medical history, family history, social history, surgical history, and allergies today and no changes were needed.  Please see the problem list section below in epic for further details.  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, "intestines were twisted around each other", has large upper abdominal transverse incision   VIDEO ASSISTED THORACOSCOPY (VATS)/EMPYEMA Left 07/28/2020   Procedure: VIDEO ASSISTED THORACOSCOPY (VATS)/EMPYEMA;  Surgeon: Melrose Nakayama, MD;  Location: [facility] OR;   Service: Thoracic;  Laterality: Left;   Social History: Social History   Socioeconomic History   Marital status: Significant Other    Spouse name: Not on file   Number of children: Not on file   Years of education: Not on file   Highest education level: Not on file  Occupational History   Not on file  Tobacco Use   Smoking status: Never   Smokeless tobacco: Never  Vaping Use   Vaping Use: Never used  Substance and Sexual Activity   Alcohol use: No   Drug use: No   Sexual activity: Yes    Birth control/protection: None  Other Topics Concern   Not on file  Social History Narrative   Not on file   Social Determinants of Health   Financial Resource Strain: Not on file  Food Insecurity: Not on file  Transportation Needs: Not on file  Physical Activity: Not on file  Stress: Not on file  Social Connections: Not on file   Family History: Family History  Problem Relation Age of Onset   Hypertension Mother    Migraines Mother    Migraines Sister    Hypertension Brother    Migraines Brother    Hypertension Maternal Grandmother    Allergies: Allergies  Allergen Reactions   Shellfish-Derived Products Other (See Comments)    No reaction listed   Medications: See med rec.  Review of Systems: See HPI for pertinent positives and negatives.   Objective:    General: Well Developed, well  nourished, and in no acute distress.  Neuro: Alert and oriented x3.  HEENT: Normocephalic, atraumatic.  Skin: Warm and dry. Cardiac: Regular rate and rhythm.  Respiratory:  Not using accessory muscles, speaking in full sentences.  Impression and Recommendations:    1. Renovascular hypertension Blood pressure elevated today and recheck only slightly better.  Since her blood pressures have been at goal at work and home, we will not start any medication today but advised patient that she should continue to monitor with a goal of 130/80 or less.  If consistently higher, she is to reach  out to me via MyChart or telephone and we will restart amlodipine at 5 mg daily.  Patient verbalized understanding and is agreeable to the plan.  2.  Encounter for weight loss counseling She has done well losing nearly 10 pounds but appears to have plateaued.  Continue intermittent fasting if desired but also recommend logging foods daily to evaluate macros and calorie intake.  Likely needs to increase protein in her diet.  Recommend intentional exercise at least 3 times weekly including cardiovascular exercise as well as strength training.  Return in about 6 months (around 08/30/2021) for HTN follow up. ___________________________________________ Clearnce Sorrel, DNP, APRN, FNP-BC Primary Care and [place]

## 2021-04-16 ENCOUNTER — Encounter: Payer: Self-pay | Admitting: Medical-Surgical

## 2021-04-16 DIAGNOSIS — R310 Gross hematuria: Secondary | ICD-10-CM

## 2021-04-16 DIAGNOSIS — I150 Renovascular hypertension: Secondary | ICD-10-CM

## 2021-04-17 DIAGNOSIS — R4020 Unspecified coma: Secondary | ICD-10-CM | POA: Diagnosis not present

## 2021-04-17 DIAGNOSIS — Z3202 Encounter for pregnancy test, result negative: Secondary | ICD-10-CM | POA: Diagnosis not present

## 2021-04-17 DIAGNOSIS — R059 Cough, unspecified: Secondary | ICD-10-CM | POA: Diagnosis not present

## 2021-04-17 DIAGNOSIS — I959 Hypotension, unspecified: Secondary | ICD-10-CM | POA: Diagnosis not present

## 2021-04-17 DIAGNOSIS — R55 Syncope and collapse: Secondary | ICD-10-CM | POA: Diagnosis not present

## 2021-04-17 DIAGNOSIS — U071 COVID-19: Secondary | ICD-10-CM | POA: Diagnosis not present

## 2021-04-17 DIAGNOSIS — N179 Acute kidney failure, unspecified: Secondary | ICD-10-CM | POA: Diagnosis not present

## 2021-04-17 DIAGNOSIS — R0902 Hypoxemia: Secondary | ICD-10-CM | POA: Diagnosis not present

## 2021-04-29 ENCOUNTER — Other Ambulatory Visit: Payer: Self-pay

## 2021-04-29 ENCOUNTER — Ambulatory Visit (INDEPENDENT_AMBULATORY_CARE_PROVIDER_SITE_OTHER): Payer: Medicaid Other | Admitting: Medical-Surgical

## 2021-04-29 ENCOUNTER — Encounter: Payer: Self-pay | Admitting: Medical-Surgical

## 2021-04-29 VITALS — BP 124/80 | HR 70 | Resp 20 | Ht 63.75 in | Wt 223.5 lb

## 2021-04-29 DIAGNOSIS — N189 Chronic kidney disease, unspecified: Secondary | ICD-10-CM

## 2021-04-29 DIAGNOSIS — Z09 Encounter for follow-up examination after completed treatment for conditions other than malignant neoplasm: Secondary | ICD-10-CM | POA: Diagnosis not present

## 2021-04-29 DIAGNOSIS — N179 Acute kidney failure, unspecified: Secondary | ICD-10-CM

## 2021-04-29 NOTE — Progress Notes (Signed)
  HPI with pertinent ROS:   CC: Hospital follow-up  HPI: Pleasant 35 year old female presenting today for hospital discharge follow-up after experiencing a syncopal episode while at work.  Notes that she had tested positive for COVID after being exposed by her children.  She was completely asymptomatic and was allowed to continue working.  She remembers being at work and being told that she would need to go to a different unit to work due to staffing concerns.  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  She was able to lowered herself to the floor so no injury occurred from a fall but ultimately, she was taken to the hospital for evaluation.  They did test her there with 1 negative followed by 1 positive COVID test.  They treated her for dehydration with IV fluid resuscitation.  She began to feel significantly better and was ultimately discharged home.  Unfortunately, while she was in the emergency room, her labs showed a worsening of her kidney failure with her creatinine elevating up to 2.84 at its highest.  Her creatinine on discharge was 2.71 with a GFR reduced to 23.  Previous baseline creatinine of 1.81 with GFR of 37.  Today, she presents feeling fairly well with no significant complaints outside of her most recent issues with shortness of breath status post VATS procedure.  I reviewed the past medical history, family history, social history, surgical history, and allergies today and no changes were needed.  Please see the problem list section below in epic for further details.   Physical exam:   General: Well Developed, well nourished, and in no acute distress.  Neuro: Alert and oriented x3.  HEENT: Normocephalic, atraumatic.  Skin: Warm and dry. Cardiac: Regular rate and rhythm, no murmurs rubs or gallops, no lower extremity edema.  Respiratory: Clear to auscultation bilaterally. Not using accessory muscles,  speaking in full sentences.  Impression and Recommendations:    1. Hospital discharge follow-up 2. Acute renal failure superimposed on chronic kidney disease, unspecified CKD stage, unspecified acute renal failure type ([place]) Rechecking CBC with differential, BMP, and phosphorus today.  Recommend notifying nephrology of her recent illness and AKI on chronic kidney disease to see if they want her to come in a little earlier.  Recommend staying well-hydrated at home.  Monitor for any worsening symptoms.  Avoid nephrotoxic medications. - BASIC METABOLIC PANEL WITH GFR - Phosphorus - CBC w/Diff/Platelet  Return in about 4 weeks (around 05/27/2021) for HTN follow up. ___________________________________________ Clearnce Sorrel, DNP, APRN, FNP-BC Primary Care and [place]

## 2021-04-30 LAB — BASIC METABOLIC PANEL WITH GFR
BUN/Creatinine Ratio: 7 (calc) (ref 6–22)
BUN: 19 mg/dL (ref 7–25)
CO2: 22 mmol/L (ref 20–32)
Calcium: 9.1 mg/dL (ref 8.6–10.2)
Chloride: 107 mmol/L (ref 98–110)
Creat: 2.64 mg/dL — ABNORMAL HIGH (ref 0.50–0.97)
Glucose, Bld: 72 mg/dL (ref 65–99)
Potassium: 4.9 mmol/L (ref 3.5–5.3)
Sodium: 140 mmol/L (ref 135–146)
eGFR: 23 mL/min/{1.73_m2} — ABNORMAL LOW (ref >=60)

## 2021-04-30 LAB — PHOSPHORUS: Phosphorus: 2.9 mg/dL (ref 2.5–4.5)

## 2021-04-30 LAB — CBC WITH DIFFERENTIAL/PLATELET
Absolute Monocytes: 525 cells/uL (ref 200–950)
Basophils Absolute: 49 cells/uL (ref 0–200)
Basophils Relative: 0.7 %
Eosinophils Absolute: 210 cells/uL (ref 15–500)
Eosinophils Relative: 3 %
HCT: 37.6 % (ref 35.0–45.0)
Hemoglobin: 12.4 g/dL (ref 11.7–15.5)
Lymphs Abs: 2296 cells/uL (ref 850–3900)
MCH: 29 pg (ref 27.0–33.0)
MCHC: 33 g/dL (ref 32.0–36.0)
MCV: 88.1 fL (ref 80.0–100.0)
MPV: 12.1 fL (ref 7.5–12.5)
Monocytes Relative: 7.5 %
Neutro Abs: 3920 cells/uL (ref 1500–7800)
Neutrophils Relative %: 56 %
Platelets: 263 10*3/uL (ref 140–400)
RBC: 4.27 10*6/uL (ref 3.80–5.10)
RDW: 13.2 % (ref 11.0–15.0)
Total Lymphocyte: 32.8 %
WBC: 7 10*3/uL (ref 3.8–10.8)

## 2021-05-17 ENCOUNTER — Other Ambulatory Visit: Payer: Self-pay

## 2021-05-17 ENCOUNTER — Ambulatory Visit (INDEPENDENT_AMBULATORY_CARE_PROVIDER_SITE_OTHER): Payer: Medicaid Other | Admitting: Medical-Surgical

## 2021-05-17 VITALS — BP 139/80 | HR 93 | Temp 98.0°F

## 2021-05-17 DIAGNOSIS — Z111 Encounter for screening for respiratory tuberculosis: Secondary | ICD-10-CM

## 2021-05-17 NOTE — Progress Notes (Signed)
Patient presents today for PPD placement to screen for tuberculosis. Patient does have any paperwork that needs to be completed for this screening that she will bring with her when she returns for the PPD reading.    Patient denies CP, palpitations, ShOB, abd pain, headache, mood swings.   PPD administered ID in left arm. Pt tolerated administration well and without complications.   Patient instructed to return to the clinic in 48-72 hours for PPD reading. Patient verbalized understanding. Instructed patient to stop at check out to schedule a nurse visit on Thursday after 2:30 PM or on Friday before 2:30 PM for reading.

## 2021-05-18 ENCOUNTER — Ambulatory Visit: Payer: Medicaid Other

## 2021-05-19 ENCOUNTER — Ambulatory Visit (INDEPENDENT_AMBULATORY_CARE_PROVIDER_SITE_OTHER): Payer: Medicaid Other | Admitting: Medical-Surgical

## 2021-05-19 ENCOUNTER — Other Ambulatory Visit: Payer: Self-pay

## 2021-05-19 VITALS — BP 131/70 | HR 91

## 2021-05-19 DIAGNOSIS — Z111 Encounter for screening for respiratory tuberculosis: Secondary | ICD-10-CM | POA: Diagnosis not present

## 2021-05-19 LAB — TB SKIN TEST
Induration: 0 mm
TB Skin Test: NEGATIVE

## 2021-05-19 NOTE — Progress Notes (Signed)
HPI:  Patient is here for PPD read.  A&P:  Results were negative, 0 mm induration.  Charyl Bigger, CMA

## 2021-05-24 ENCOUNTER — Ambulatory Visit (INDEPENDENT_AMBULATORY_CARE_PROVIDER_SITE_OTHER): Payer: Medicaid Other | Admitting: Medical-Surgical

## 2021-05-24 DIAGNOSIS — Z111 Encounter for screening for respiratory tuberculosis: Secondary | ICD-10-CM | POA: Diagnosis not present

## 2021-05-24 NOTE — Progress Notes (Signed)
Established Patient Office Visit  Subjective:  Patient ID: Lindsay Hall, female    DOB: 05/10/1986  Age: 35 y.o. MRN: [id number]  CC:  Chief Complaint  Patient presents with   PPD Placement    HPI Lindsay Hall presents for PPD placement.   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, "intestines were twisted around each other", has large upper abdominal transverse incision   VIDEO ASSISTED THORACOSCOPY (VATS)/EMPYEMA Left 07/28/2020   Procedure: VIDEO ASSISTED THORACOSCOPY (VATS)/EMPYEMA;  Surgeon: Melrose Nakayama, MD;  Location: [facility] OR;  Service: Thoracic;  Laterality: Left;    Family History  Problem Relation Age of Onset   Hypertension Mother    Migraines Mother    Migraines Sister    Hypertension Brother    Migraines Brother    Hypertension Maternal Grandmother     Social History   Socioeconomic History   Marital status: Significant Other    Spouse name: Not on file   Number of children: Not on file   Years of education: Not on file   Highest education level: Not on file  Occupational History   Not on file  Tobacco Use   Smoking status: Never   Smokeless tobacco: Never  Vaping Use   Vaping Use: Never used  Substance and Sexual Activity   Alcohol use: No   Drug use: No   Sexual activity: Yes    Birth control/protection: None  Other Topics Concern   Not on file  Social History Narrative   Not on file   Social Determinants of Health   Financial Resource Strain: Not on file  Food Insecurity: Not on file  Transportation Needs: Not on file  Physical Activity: Not on file  Stress: Not on file   Social Connections: Not on file  Intimate Partner Violence: Not on file    Outpatient Medications Prior to Visit  Medication Sig Dispense Refill   amLODipine (NORVASC) 10 MG tablet Take 10 mg by mouth daily.     etonogestrel (NEXPLANON) 68 MG IMPL implant 1 each by Subdermal route once.     Multiple Vitamins-Minerals (CENTRUM WOMEN) TABS Take 1 tablet by mouth.     No facility-administered medications prior to visit.    Allergies  Allergen Reactions   Shellfish-Derived Products Other (See Comments)    No reaction listed    ROS Review of Systems    Objective:    Physical Exam  There were no vitals taken for this visit. Wt Readings from Last 3 Encounters:  04/29/21 223 lb 8 oz (101.4 kg)  02/28/21 223 lb 4.8 oz (101.3 kg)  11/10/20 233 lb 9.6 oz (106 kg)     There are no preventive care reminders to display for this patient.  There are no preventive care reminders to display for this patient.  Lab Results  Component Value Date   TSH 2.00 11/10/2020   Lab Results  Component Value Date   WBC 7.0 04/29/2021   HGB 12.4 04/29/2021   HCT 37.6 04/29/2021   MCV 88.1 04/29/2021   PLT 263 04/29/2021  Lab Results  Component Value Date   NA 140 04/29/2021   K 4.9 04/29/2021   CO2 22 04/29/2021   GLUCOSE 72 04/29/2021   BUN 19 04/29/2021   CREATININE 2.64 (H) 04/29/2021   BILITOT 0.2 11/10/2020   ALKPHOS 67 07/30/2020   AST 9 (L) 11/10/2020   ALT 9 11/10/2020   PROT 6.6 11/10/2020   ALBUMIN 1.3 (L) 07/30/2020   CALCIUM 9.1 04/29/2021   ANIONGAP 12 08/03/2020   EGFR 23 (L) 04/29/2021   Lab Results  Component Value Date   CHOL 230 (H) 11/10/2020   Lab Results  Component Value Date   HDL 50 11/10/2020   Lab Results  Component Value Date   LDLCALC 164 (H) 11/10/2020   Lab Results  Component Value Date   TRIG 64 11/10/2020   Lab Results  Component Value Date   CHOLHDL 4.6 11/10/2020   Lab Results  Component Value Date   HGBA1C 5.3 11/10/2020       Assessment & Plan:  PPD placement - Placed on right arm. Patient tolerated injection well without complications. Patient advised to schedule PPD read 2 days from today.     Problem List Items Addressed This Visit   None Visit Diagnoses     Screening-pulmonary TB    -  Primary   Relevant Orders   PPD (Completed)       No orders of the defined types were placed in this encounter.   Follow-up: Return in about 2 days (around 05/26/2021) for PPD read. Durene Romans, Monico Blitz, [place]

## 2021-05-27 ENCOUNTER — Other Ambulatory Visit: Payer: Self-pay

## 2021-05-27 ENCOUNTER — Ambulatory Visit (INDEPENDENT_AMBULATORY_CARE_PROVIDER_SITE_OTHER): Payer: Medicaid Other | Admitting: Medical-Surgical

## 2021-05-27 VITALS — BP 144/96 | HR 63

## 2021-05-27 DIAGNOSIS — Z111 Encounter for screening for respiratory tuberculosis: Secondary | ICD-10-CM

## 2021-05-27 LAB — TB SKIN TEST
Induration: 0 mm
TB Skin Test: NEGATIVE

## 2021-05-27 NOTE — Progress Notes (Signed)
Pt's bp was elevated this afternoon but she admitted that she did not take her Amlodipine today.

## 2021-06-02 ENCOUNTER — Ambulatory Visit: Payer: Medicaid Other

## 2021-06-02 DIAGNOSIS — N179 Acute kidney failure, unspecified: Secondary | ICD-10-CM | POA: Diagnosis not present

## 2021-07-19 ENCOUNTER — Encounter: Payer: Self-pay | Admitting: Medical-Surgical

## 2021-07-19 DIAGNOSIS — N179 Acute kidney failure, unspecified: Secondary | ICD-10-CM

## 2021-08-11 ENCOUNTER — Telehealth: Payer: Self-pay | Admitting: Medical-Surgical

## 2021-08-11 NOTE — Telephone Encounter (Signed)
..   Medicaid Managed Care   Unsuccessful Outreach Note  08/11/2021 Name: Lindsay Hall MRN: [id number] DOB: 11-30-1985  Referred by: Samuel Bouche, NP Reason for referral : High Risk Managed Medicaid (I called the patient today to offer the services of the MM Team. I left my name and number on her VM.)   An unsuccessful telephone outreach was attempted today. The patient was referred to the case management team for assistance with care management and care coordination.   Follow Up Plan: The care management team will reach out to the patient again over the next 14 days.   [place]

## 2021-08-22 DIAGNOSIS — N179 Acute kidney failure, unspecified: Secondary | ICD-10-CM | POA: Diagnosis not present

## 2021-08-30 ENCOUNTER — Encounter: Payer: Self-pay | Admitting: Medical-Surgical

## 2021-08-30 ENCOUNTER — Ambulatory Visit (INDEPENDENT_AMBULATORY_CARE_PROVIDER_SITE_OTHER): Payer: Medicaid Other | Admitting: Medical-Surgical

## 2021-08-30 ENCOUNTER — Other Ambulatory Visit: Payer: Self-pay

## 2021-08-30 VITALS — BP 152/86 | HR 76 | Resp 20 | Ht 63.75 in | Wt 227.6 lb

## 2021-08-30 DIAGNOSIS — N179 Acute kidney failure, unspecified: Secondary | ICD-10-CM | POA: Diagnosis not present

## 2021-08-30 DIAGNOSIS — I150 Renovascular hypertension: Secondary | ICD-10-CM

## 2021-08-30 NOTE — Progress Notes (Signed)
°  HPI with pertinent ROS:   CC: HTN follow up  HPI: Pleasant 35 year old female presenting for follow up on HTN. 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 Has been taking Amlodipine 10mg  daily, tolerating well without side effects. Not regularly checking her BP. Limiting her intake of sodium. Is followed by nephrology with her last visit about a week ago. Had labs drawn then but wants to review the results since she didn't hear back from the nephrology office about them. Denies CP, SOB, palpitations, lower extremity edema, dizziness, headaches, or vision changes.  Wants to get her Nexplanon out. Has had it about a year and thinks it's messing with her mood a lot.   I reviewed the past medical history, family history, social history, surgical history, and allergies today and no changes were needed.  Please see the problem list section below in epic for further details.   Physical exam:   General: Well Developed, well nourished, and in no acute distress.  Neuro: Alert and oriented x3.  HEENT: Normocephalic, atraumatic.  Skin: Warm and dry. Cardiac: Regular rate and rhythm, no murmurs rubs or gallops, no lower extremity edema.  Respiratory: Clear to auscultation bilaterally. Not using accessory muscles, speaking in full sentences.  Impression and Recommendations:    1. Renovascular hypertension BP elevated at initial check and again at recheck but looked good last week at 139/70. She is just getting off from a night shift at work and is pretty stressed/tired. Plan to return in 2 weeks for a NV for BP to see if we need to make adjustments to her medication. Continue a low sodium diet.   2. Acute kidney injury ([place]) Reviewed most recent labs. Her creatinine is back up with a mild elevation in BUN. Stay well hydrated and continue to avoid nephrotoxic medications. Per nephrology's note, she may need a  kidney biopsy soon. Advised her to contact nephrology with any other specific questions regarding the plan.   Return in about 2 weeks (around 09/13/2021) for nurse visit for BP check. ___________________________________________ Clearnce Sorrel, DNP, APRN, FNP-BC Primary Care and [place]

## 2021-09-10 ENCOUNTER — Encounter: Payer: Self-pay | Admitting: Medical-Surgical

## 2021-09-12 ENCOUNTER — Other Ambulatory Visit: Payer: Self-pay | Admitting: Medical-Surgical

## 2021-09-12 DIAGNOSIS — N028 Recurrent and persistent hematuria with other morphologic changes: Secondary | ICD-10-CM | POA: Diagnosis not present

## 2021-09-12 DIAGNOSIS — N179 Acute kidney failure, unspecified: Secondary | ICD-10-CM | POA: Diagnosis not present

## 2021-09-12 NOTE — Telephone Encounter (Signed)
To Provider responding to patient.

## 2021-09-13 ENCOUNTER — Ambulatory Visit (INDEPENDENT_AMBULATORY_CARE_PROVIDER_SITE_OTHER): Payer: Medicaid Other | Admitting: Medical-Surgical

## 2021-09-13 ENCOUNTER — Other Ambulatory Visit: Payer: Self-pay

## 2021-09-13 VITALS — BP 132/76 | HR 80

## 2021-09-13 DIAGNOSIS — I150 Renovascular hypertension: Secondary | ICD-10-CM

## 2021-09-13 NOTE — Patient Instructions (Signed)

## 2021-09-13 NOTE — Progress Notes (Signed)
Patient presents today as a nurse visit for a blood pressure check.  Patient states she is taking her medication as prescribed without any side effects/adverse effects. Medication and allergy list reviewed with patient and the pharmacy has been verified.   HA: No Dizziness/lightheadedness: No Fever: No BA: No Weakness/Fatigue: No  Sinus pain/pressure: No  Runny nose: No  ST: No  ShOB: No  CP: No  Palps: No Abd pain: No Dysuria: No  N/V/C/D: No    Vital Signs at 1:47 PM Blood Pressure: 139/79 Pulse: 88 SpO2: 100%  Vital Signs at 1:57 PM Blood Pressure: 132/76 Pulse: 80 SpO2: 100%   Information shared with Samuel Bouche, FNP who instructed me to tell pt to continue current treatment, continue to monitor BP at home and keep a log, and follow up instructed.  Pt aware and verbalized understanding. Pt escorted to check out for scheduling.

## 2021-09-27 ENCOUNTER — Telehealth: Payer: Self-pay | Admitting: Medical-Surgical

## 2021-09-27 NOTE — Telephone Encounter (Signed)
.. °  Medicaid Managed Care   Unsuccessful Outreach Note  09/27/2021 Name: Lindsay Hall MRN: [id number] DOB: July 29, 1986  Referred by: Samuel Bouche, NP Reason for referral : High Risk Managed Medicaid (I called the patient today to get her scheduled with the MM Team. I left my name and number on her VM.)   A second unsuccessful telephone outreach was attempted today. The patient was referred to the case management team for assistance with care management and care coordination.   Follow Up Plan: The care management team will reach out to the patient again over the next 14 days.   [place]

## 2021-09-28 DIAGNOSIS — N028 Recurrent and persistent hematuria with other morphologic changes: Secondary | ICD-10-CM | POA: Diagnosis not present

## 2021-09-30 ENCOUNTER — Telehealth: Payer: Self-pay | Admitting: Medical-Surgical

## 2021-09-30 NOTE — Telephone Encounter (Signed)
.. °  Medicaid Managed Care   Unsuccessful Outreach Note  09/30/2021 Name: Lindsay Hall MRN: [id number] DOB: 06-24-1986  Referred by: Samuel Bouche, NP Reason for referral : High Risk Managed Medicaid (I called the patient today to get her scheduled with the MM Team. I left my name and number on her VM.)   A second unsuccessful telephone outreach was attempted today. The patient was referred to the case management team for assistance with care management and care coordination.   Follow Up Plan: The care management team will reach out to the patient again over the next 14 days.   [place]

## 2021-10-21 MED ORDER — AMLODIPINE BESYLATE 10 MG PO TABS: 10.0000 mg | ORAL_TABLET | Freq: Every day | ORAL | 1 refills | Status: DC

## 2021-10-24 NOTE — Telephone Encounter (Signed)
Scheduled for tomorrow afternoon at 4pm for NV for BP check.

## 2021-10-25 ENCOUNTER — Ambulatory Visit (INDEPENDENT_AMBULATORY_CARE_PROVIDER_SITE_OTHER): Payer: Medicaid Other | Admitting: Medical-Surgical

## 2021-10-25 ENCOUNTER — Other Ambulatory Visit: Payer: Self-pay

## 2021-10-25 VITALS — BP 152/80 | HR 70

## 2021-10-25 DIAGNOSIS — I10 Essential (primary) hypertension: Secondary | ICD-10-CM | POA: Diagnosis not present

## 2021-10-25 MED ORDER — CARVEDILOL 3.125 MG PO TABS: 3.1250 mg | ORAL_TABLET | Freq: Two times a day (BID) | ORAL | 3 refills | Status: DC

## 2021-10-25 NOTE — Progress Notes (Signed)
Agree with documentation as below.  Carvedilol 3.125 mg twice daily sent to the pharmacy on file.  ___________________________________________ Clearnce Sorrel, DNP, APRN, FNP-BC Primary Care and [place]

## 2021-10-25 NOTE — Progress Notes (Signed)
Established Patient Office Visit  Subjective:  Patient ID: Lindsay Hall, female    DOB: 08/19/1986  Age: 36 y.o. MRN: [id number]  CC:  Chief Complaint  Patient presents with   Hypertension    HPI Druscilla Petsch presents for blood pressure check. Denies chest pain, shortness of breath or dizziness.    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, "intestines were twisted around each other", has large upper abdominal transverse incision   VIDEO ASSISTED THORACOSCOPY (VATS)/EMPYEMA Left 07/28/2020   Procedure: VIDEO ASSISTED THORACOSCOPY (VATS)/EMPYEMA;  Surgeon: Melrose Nakayama, MD;  Location: [facility] OR;  Service: Thoracic;  Laterality: Left;    Family History  Problem Relation Age of Onset   Hypertension Mother    Migraines Mother    Migraines Sister    Hypertension Brother    Migraines Brother    Hypertension Maternal Grandmother     Social History   Socioeconomic History   Marital status: Significant Other    Spouse name: Not on file   Number of children: Not on file   Years of education: Not on file   Highest education level: Not on file  Occupational History   Not on file  Tobacco Use   Smoking status: Never   Smokeless tobacco: Never  Vaping Use   Vaping Use: Never used  Substance and Sexual Activity   Alcohol use: No   Drug use: No   Sexual activity: Yes    Birth control/protection: None  Other Topics Concern   Not on file  Social History Narrative   Not on file   Social Determinants of Health   Financial  Resource Strain: Not on file  Food Insecurity: Not on file  Transportation Needs: Not on file  Physical Activity: Not on file  Stress: Not on file  Social Connections: Not on file  Intimate Partner Violence: Not on file    Outpatient Medications Prior to Visit  Medication Sig Dispense Refill   amLODipine (NORVASC) 10 MG tablet Take 1 tablet (10 mg total) by mouth daily. 90 tablet 1   etonogestrel (NEXPLANON) 68 MG IMPL implant Inject 1 each into the skin once.     No facility-administered medications prior to visit.    Allergies  Allergen Reactions   Shellfish-Derived Products Other (See Comments)    No reaction listed    ROS Review of Systems    Objective:    Physical Exam  BP (!) 152/80    Pulse 70    SpO2 100%  Wt Readings from Last 3 Encounters:  08/30/21 227 lb 9.6 oz (103.2 kg)  04/29/21 223 lb 8 oz (101.4 kg)  02/28/21 223 lb 4.8 oz (101.3 kg)     There are no preventive care reminders to display for this patient.  There are no preventive care reminders to display for this patient.  Lab Results  Component Value Date   TSH 2.00 11/10/2020   Lab Results  Component Value Date   WBC 7.0 04/29/2021  HGB 12.4 04/29/2021   HCT 37.6 04/29/2021   MCV 88.1 04/29/2021   PLT 263 04/29/2021   Lab Results  Component Value Date   NA 140 04/29/2021   K 4.9 04/29/2021   CO2 22 04/29/2021   GLUCOSE 72 04/29/2021   BUN 19 04/29/2021   CREATININE 2.64 (H) 04/29/2021   BILITOT 0.2 11/10/2020   ALKPHOS 67 07/30/2020   AST 9 (L) 11/10/2020   ALT 9 11/10/2020   PROT 6.6 11/10/2020   ALBUMIN 1.3 (L) 07/30/2020   CALCIUM 9.1 04/29/2021   ANIONGAP 12 08/03/2020   EGFR 23 (L) 04/29/2021   Lab Results  Component Value Date   CHOL 230 (H) 11/10/2020   Lab Results  Component Value Date   HDL 50 11/10/2020   Lab Results  Component Value Date   LDLCALC 164 (H) 11/10/2020   Lab Results  Component Value Date   TRIG 64 11/10/2020   Lab Results  Component  Value Date   CHOLHDL 4.6 11/10/2020   Lab Results  Component Value Date   HGBA1C 5.3 11/10/2020      Assessment & Plan:  Hypertension - Per Joy take Coreg bid along with the amlodipine. Follow up in two weeks.    Problem List Items Addressed This Visit   None Visit Diagnoses     Hypertension, unspecified type    -  Primary       No orders of the defined types were placed in this encounter.   Follow-up: Return in about 2 weeks (around 11/08/2021) for blood pressure check. Durene Romans, Monico Blitz, [place]

## 2021-11-08 ENCOUNTER — Other Ambulatory Visit: Payer: Self-pay

## 2021-11-08 ENCOUNTER — Ambulatory Visit (INDEPENDENT_AMBULATORY_CARE_PROVIDER_SITE_OTHER): Payer: Medicaid Other | Admitting: Medical-Surgical

## 2021-11-08 VITALS — BP 128/64 | HR 73

## 2021-11-08 DIAGNOSIS — I10 Essential (primary) hypertension: Secondary | ICD-10-CM

## 2021-11-08 NOTE — Progress Notes (Signed)
Agree with documentation as above.  ? ?___________________________________________ ?Lindsay Pankowski L. Raylie Maddison, DNP, APRN, FNP-BC ?Primary Care and Sports Medicine ?[facility] ? ?

## 2021-11-08 NOTE — Progress Notes (Signed)
Pt here for nurse BP check.  Pt denies CP, SOB, headaches, dizziness or missed doses of medications.  T. Bellarose Burtt, CMA ? ?

## 2021-11-14 NOTE — Progress Notes (Unsigned)
°  HPI with pertinent ROS:   CC:   HPI:   I reviewed the past medical history, family history, social history, surgical history, and allergies today and no changes were needed.  Please see the problem list section below in epic for further details.   Physical exam:   General: Well Developed, well nourished, and in no acute distress.  Neuro: Alert and oriented x3, extra-ocular muscles intact, sensation grossly intact.  HEENT: Normocephalic, atraumatic, pupils equal round reactive to light, neck supple, no masses, no lymphadenopathy, thyroid nonpalpable.  Skin: Warm and dry, no rashes. Cardiac: Regular rate and rhythm, no murmurs rubs or gallops, no lower extremity edema.  Respiratory: Clear to auscultation bilaterally. Not using accessory muscles, speaking in full sentences.  Impression and Recommendations:    There are no diagnoses linked to this encounter.  No follow-ups on file. ___________________________________________ Clearnce Sorrel, DNP, APRN, FNP-BC Primary Care and [place]

## 2021-11-15 ENCOUNTER — Encounter: Payer: Medicaid Other | Admitting: Medical-Surgical

## 2021-11-21 DIAGNOSIS — N028 Recurrent and persistent hematuria with other morphologic changes: Secondary | ICD-10-CM | POA: Diagnosis not present

## 2021-12-27 ENCOUNTER — Ambulatory Visit: Payer: Medicaid Other | Admitting: Physician Assistant

## 2022-02-09 ENCOUNTER — Telehealth (INDEPENDENT_AMBULATORY_CARE_PROVIDER_SITE_OTHER): Payer: Medicaid Other | Admitting: Medical-Surgical

## 2022-02-09 ENCOUNTER — Encounter: Payer: Self-pay | Admitting: Medical-Surgical

## 2022-02-09 DIAGNOSIS — T50905A Adverse effect of unspecified drugs, medicaments and biological substances, initial encounter: Secondary | ICD-10-CM

## 2022-02-09 MED ORDER — CLOBETASOL PROPIONATE 0.05 % EX CREA: 1.0000 "application " | TOPICAL_CREAM | Freq: Two times a day (BID) | CUTANEOUS | 0 refills | Status: DC | PRN

## 2022-02-09 NOTE — Progress Notes (Signed)
Virtual Visit via Video Note  I connected with Lindsay Hall on 02/09/22 at  1:00 PM EDT by a video enabled telemedicine application and verified that I am speaking with the correct person using two identifiers.   I discussed the limitations of evaluation and management by telemedicine and the availability of in person appointments. The patient expressed understanding and agreed to proceed.  Patient location: home Provider locations: office  Subjective:    CC: Possible medication reaction  HPI: Pleasant 36 year old female presenting today via MyChart video visit to discuss a possible medication reaction.  She was prescribed Tarpeyo by her nephrologist to treat her kidney disease.  She started taking this medication and noted that she had a rash that started between her thighs.  Since then it has spread to affect her hands and feet as well.  Notes that her feet and hands have very dry cracking skin on them now and her skin is started peeling in all areas that are affected.  The rash between her thighs has also spread to the back of her legs.  She notes that this has been very uncomfortable.  She did talk to the manufacturer of the drug who recommended she drink at least 64 ounces of water per day however when she did this, she developed significant swelling of her feet and ankles.  Since then, she cut back on her water intake and the swelling has gotten better.  She has been using Aquaphor topically but notes only minimal benefit to this.  She has an appointment with nephrology on 6/20 to follow-up on the medication.  Until then, they have instructed her to stop the medication.  Past medical history, Surgical history, Family history not pertinant except as noted below, Social history, Allergies, and medications have been entered into the medical record, reviewed, and corrections made.   Review of Systems: See HPI for pertinent positives and negatives.   Objective:    General: Speaking clearly  in complete sentences without any shortness of breath.  Alert and oriented x3.  Normal judgment. No apparent acute distress.  Impression and Recommendations:    1. Adverse effect of drug, initial encounter With the timing, it does appear that her symptoms are related to the medication.  Unable to perform skin scraping or other evaluation due to virtual nature of the visit.  We will go ahead and treat with topical clobetasol twice daily as needed for up to 14 days to see if this will help resolve her current symptoms.  Follow-up with nephrology as scheduled regarding resuming the medication or changing to a different agent.  Letter sent via MyChart to clear her for returning to work.  I discussed the assessment and treatment plan with the patient. The patient was provided an opportunity to ask questions and all were answered. The patient agreed with the plan and demonstrated an understanding of the instructions.   The patient was advised to call back or seek an in-person evaluation if the symptoms worsen or if the condition fails to improve as anticipated.  25 minutes of non-face-to-face time was provided during this encounter.  Return if symptoms worsen or fail to improve.  Clearnce Sorrel, DNP, APRN, FNP-BC [place] [facility]

## 2022-02-12 ENCOUNTER — Other Ambulatory Visit: Payer: Self-pay | Admitting: Medical-Surgical

## 2022-02-21 DIAGNOSIS — N028 Recurrent and persistent hematuria with other morphologic changes: Secondary | ICD-10-CM | POA: Diagnosis not present

## 2022-03-17 ENCOUNTER — Encounter: Payer: Self-pay | Admitting: Medical-Surgical

## 2022-03-21 ENCOUNTER — Other Ambulatory Visit: Payer: Self-pay | Admitting: Medical-Surgical

## 2022-03-22 NOTE — Telephone Encounter (Signed)
Contacted patient and she will fill out her part of the form and re-send through [place].

## 2022-03-22 NOTE — Telephone Encounter (Signed)
Please send provider completed form via fax to clear her for return to work and FIT testing.

## 2022-03-23 NOTE — Telephone Encounter (Signed)
Printed forms and faxed to [phone]

## 2022-03-24 NOTE — Progress Notes (Unsigned)
Last Mammogram: never Last Pap Smear:  06/24/20 NML Last Colon Screening;  never Seat Belts:   yes Sun Screen:   yes Dental Check Up:  yes Brush & Floss:  yes

## 2022-03-26 NOTE — Progress Notes (Signed)
Complete physical exam  Patient: Lindsay Hall   DOB: [birth year]   36 y.o. Female  MRN: [id number]  Subjective:    Chief Complaint  Patient presents with   Annual Exam    Lindsay Hall is a 36 y.o. female who presents today for a complete physical exam. She reports consuming a general diet. The patient does not participate in regular exercise at present. She generally feels fairly well. She reports sleeping well. She does not have additional problems to discuss today.    Most recent fall risk assessment:    03/27/2022    2:56 PM  [place] in the past year? 0  Number falls in past yr: 0  Injury with Fall? 0  Risk for fall due to : No Fall Risks  Follow up Falls evaluation completed     Most recent depression screenings:    03/27/2022    2:56 PM 08/30/2021   10:09 AM  PHQ 2/9 Scores  PHQ - 2 Score 0 0    Vision:Not within last year , Dental: No current dental problems and No regular dental care , and STD: The patient reports a past history of: chlamydia    Patient Care Team: Samuel Bouche, NP as PCP - General (Nurse Practitioner)   Outpatient Medications Prior to Visit  Medication Sig   amLODipine (NORVASC) 10 MG tablet Take 1 tablet (10 mg total) by mouth daily.   clobetasol cream (TEMOVATE) 9.67 % Apply 1 application. topically 2 (two) times daily as needed.   etonogestrel (NEXPLANON) 68 MG IMPL implant Inject 1 each into the skin once.   [DISCONTINUED] carvedilol (COREG) 3.125 MG tablet TAKE 1 TABLET BY MOUTH TWICE DAILY WITH A MEAL   No facility-administered medications prior to visit.   Review of Systems  Constitutional:  Negative for chills, fever, malaise/fatigue and weight loss.  HENT:  Negative for congestion, ear pain, hearing loss, sinus pain and sore throat.   Eyes:  Negative for blurred vision, photophobia and pain.  Respiratory:  Negative for cough, shortness of breath and wheezing.   Cardiovascular:  Negative for chest pain, palpitations  and leg swelling.  Gastrointestinal:  Negative for abdominal pain, constipation, diarrhea, heartburn, nausea and vomiting.  Genitourinary:  Negative for dysuria, frequency and urgency.  Musculoskeletal:  Negative for falls and neck pain.  Skin:  Negative for itching and rash.  Neurological:  Negative for dizziness, weakness and headaches.  Endo/Heme/Allergies:  Negative for polydipsia. Does not bruise/bleed easily.  Psychiatric/Behavioral:  Negative for depression, substance abuse and suicidal ideas. The patient is not nervous/anxious.      Objective:     BP 131/84   Pulse 82   Ht 5' 3.75" (1.619 m)   Wt 226 lb (102.5 kg)   SpO2 100%   BMI 39.10 kg/m    Physical Exam Constitutional:      General: She is not in acute distress.    Appearance: Normal appearance. She is not ill-appearing.  HENT:     Head: Normocephalic and atraumatic.     Right Ear: Tympanic membrane, ear canal and external ear normal. There is no impacted cerumen.     Left Ear: Tympanic membrane, ear canal and external ear normal. There is no impacted cerumen.     Nose: Nose normal.     Mouth/Throat:     Mouth: Mucous membranes are moist.     Pharynx: No oropharyngeal exudate or posterior oropharyngeal erythema.  Eyes:     Extraocular Movements:  Extraocular movements intact.     Conjunctiva/sclera: Conjunctivae normal.     Pupils: Pupils are equal, round, and reactive to light.  Neck:     Thyroid: No thyromegaly.     Vascular: No carotid bruit or JVD.     Trachea: Trachea normal.  Cardiovascular:     Rate and Rhythm: Normal rate and regular rhythm.     Pulses: Normal pulses.     Heart sounds: Normal heart sounds. No murmur heard.    No friction rub. No gallop.  Pulmonary:     Effort: Pulmonary effort is normal. No respiratory distress.     Breath sounds: Normal breath sounds. No wheezing.  Abdominal:     General: Bowel sounds are normal. There is no distension.     Palpations: Abdomen is soft.      Tenderness: There is no abdominal tenderness. There is no guarding.  Musculoskeletal:        General: Normal range of motion.     Cervical back: Normal range of motion and neck supple.  Skin:    General: Skin is warm and dry.  Neurological:     Mental Status: She is alert and oriented to person, place, and time.     Cranial Nerves: No cranial nerve deficit.  Psychiatric:        Mood and Affect: Mood normal.        Behavior: Behavior normal.        Thought Content: Thought content normal.        Judgment: Judgment normal.   No results found for any visits on 03/27/22.     Assessment & Plan:    Routine Health Maintenance and Physical Exam  Immunization History  Administered Date(s) Administered   DTaP 11/26/1984, 03/18/1985, 03/07/1986, 05/15/1986, 09/17/1989   Hepatitis B 01/19/1998, 06/29/1999, 01/13/2000, 03/31/2002   HiB (PRP-T) 03/29/1987   IPV 11/26/1984, 03/18/1985, 05/05/1986, 09/17/1989   Influenza, Seasonal, Injecte, Preservative Fre 07/07/2014   Influenza,inj,Quad PF,6+ Mos 09/25/2016   Influenza-Unspecified 07/07/2014, 06/10/2015   MMR 03/24/1986, 09/17/1989   PPD Test 09/25/2016, 10/02/2016, 05/17/2021, 05/24/2021, 03/27/2022   Td 06/29/1999   Tdap 10/08/2007, 09/25/2016   Varicella 10/02/2016    Health Maintenance  Topic Date Due   INFLUENZA VACCINE  04/04/2022   PAP SMEAR-Modifier  06/25/2023   TETANUS/TDAP  09/25/2026   Hepatitis C Screening  Completed   HIV Screening  Completed   HPV VACCINES  Aged Out   COVID-19 Vaccine  Discontinued    Discussed health benefits of physical activity, and encouraged her to engage in regular exercise appropriate for her age and condition.  1. Annual physical exam Checking lipid panel. Recent CBC/CMP with nephrology. Wellness information provided with AVS.   2. Immunity status testing PPD placed. Return to the office in 48-72 hours for read.  - PPD  3. Renovascular hypertension BP looks good today. Continue  Amlodipine 11m daily and Coreg 3.125 BID.   4. IgA nephropathy Managed by nephrology.   5. Interstitial fibrosis present on renal biopsy Managed by nephrology.  6. Prediabetes Checking A1c today.   Return in about 6 months (around 09/27/2022) for HTN follow up.   JSamuel Bouche NP

## 2022-03-27 ENCOUNTER — Encounter: Payer: Self-pay | Admitting: Obstetrics & Gynecology

## 2022-03-27 ENCOUNTER — Ambulatory Visit (INDEPENDENT_AMBULATORY_CARE_PROVIDER_SITE_OTHER): Payer: Medicaid Other | Admitting: Medical-Surgical

## 2022-03-27 ENCOUNTER — Ambulatory Visit (INDEPENDENT_AMBULATORY_CARE_PROVIDER_SITE_OTHER): Payer: Medicaid Other | Admitting: Obstetrics & Gynecology

## 2022-03-27 ENCOUNTER — Other Ambulatory Visit (HOSPITAL_COMMUNITY)
Admission: RE | Admit: 2022-03-27 | Discharge: 2022-03-27 | Disposition: A | Discharge status: Home / Self Care | Payer: Medicaid Other | Source: Ambulatory Visit | Attending: Obstetrics & Gynecology | Admitting: Obstetrics & Gynecology

## 2022-03-27 ENCOUNTER — Ambulatory Visit: Payer: Medicaid Other

## 2022-03-27 VITALS — BP 127/85 | HR 77 | Resp 16 | Ht 64.0 in | Wt 225.0 lb

## 2022-03-27 VITALS — BP 131/84 | HR 82 | Ht 63.75 in | Wt 226.0 lb

## 2022-03-27 DIAGNOSIS — I150 Renovascular hypertension: Secondary | ICD-10-CM

## 2022-03-27 DIAGNOSIS — Z0000 Encounter for general adult medical examination without abnormal findings: Secondary | ICD-10-CM

## 2022-03-27 DIAGNOSIS — N269 Renal sclerosis, unspecified: Secondary | ICD-10-CM | POA: Diagnosis not present

## 2022-03-27 DIAGNOSIS — N028 Recurrent and persistent hematuria with other morphologic changes: Secondary | ICD-10-CM

## 2022-03-27 DIAGNOSIS — Z0184 Encounter for antibody response examination: Secondary | ICD-10-CM | POA: Diagnosis not present

## 2022-03-27 DIAGNOSIS — Z01419 Encounter for gynecological examination (general) (routine) without abnormal findings: Secondary | ICD-10-CM | POA: Insufficient documentation

## 2022-03-27 DIAGNOSIS — N02B9 Other recurrent and persistent immunoglobulin A nephropathy: Secondary | ICD-10-CM

## 2022-03-27 DIAGNOSIS — R7303 Prediabetes: Secondary | ICD-10-CM | POA: Diagnosis not present

## 2022-03-27 DIAGNOSIS — E7800 Pure hypercholesterolemia, unspecified: Secondary | ICD-10-CM | POA: Diagnosis not present

## 2022-03-27 MED ORDER — CARVEDILOL 3.125 MG PO TABS: 3.1250 mg | ORAL_TABLET | Freq: Two times a day (BID) | ORAL | 1 refills | Status: DC

## 2022-03-27 NOTE — Progress Notes (Signed)
Subjective:     Lindsay Hall is a 36 y.o. female here for a routine exam.  Current complaints: needs pap smear to be on transplant list.     Gynecologic History No LMP recorded. Patient has had an implant. Contraception: Nexplanon Last Pap: 2021. Results were: normal  Obstetric History OB History  Gravida Para Term Preterm AB Living  4 3 3   1     SAB IAB Ectopic Multiple Live Births  1       3    # Outcome Date GA Lbr Len/2nd Weight Sex Delivery Anes PTL Lv  4 SAB           3 Term           2 Term           1 Term              The following portions of the patient's history were reviewed and updated as appropriate: allergies, current medications, past family history, past medical history, past social history, past surgical history, and problem list.  Review of Systems Pertinent items noted in HPI and remainder of comprehensive ROS otherwise negative.    Objective:     Vitals:   03/27/22 1550  BP: 127/85  Pulse: 77  Resp: 16  Weight: 225 lb (102.1 kg)  Height: 5\' 4"  (1.626 m)   Vitals:  WNL General appearance: alert, cooperative and no distress  HEENT: Normocephalic, without obvious abnormality, atraumatic Eyes: negative Throat: lips, mucosa, and tongue normal; teeth and gums normal  Respiratory: Clear to auscultation bilaterally  CV: Regular rate and rhythm  Breasts:  Normal appearance, no masses or tenderness, no nipple retraction or dimpling  GI: Soft, non-tender; bowel sounds normal; no masses,  no organomegaly  GU: External Genitalia:  Tanner V, no lesion Urethra:  No prolapse   Vagina: Pink, normal rugae, no blood or discharge  Cervix: No CMT, no lesion  Uterus:  Normal size and contour, non tender  Adnexa: Normal, no masses, non tender  Musculoskeletal: No edema, redness or tenderness in the calves or thighs  Skin: No lesions or rash  Lymphatic: Axillary adenopathy: none     Psychiatric: Normal mood and behavior        Assessment:    Healthy  female exam.    Plan:    1.  Pap smear with cotesting. 2.  Nexplanon for birth control 3.  Mammogram at 38

## 2022-03-28 LAB — HEMOGLOBIN A1C
Hgb A1c MFr Bld: 5.2 % of total Hgb (ref <5.7)
Mean Plasma Glucose: 103 mg/dL
eAG (mmol/L): 5.7 mmol/L

## 2022-03-28 LAB — LIPID PANEL
Cholesterol: 218 mg/dL — ABNORMAL HIGH (ref <200)
HDL: 40 mg/dL — ABNORMAL LOW (ref >=50)
LDL Cholesterol (Calc): 153 mg/dL (calc) — ABNORMAL HIGH
Non-HDL Cholesterol (Calc): 178 mg/dL (calc) — ABNORMAL HIGH (ref <130)
Total CHOL/HDL Ratio: 5.5 (calc) — ABNORMAL HIGH (ref <5.0)
Triglycerides: 126 mg/dL (ref <150)

## 2022-03-29 ENCOUNTER — Ambulatory Visit (INDEPENDENT_AMBULATORY_CARE_PROVIDER_SITE_OTHER): Payer: Medicaid Other | Admitting: Medical-Surgical

## 2022-03-29 VITALS — BP 131/77 | HR 80 | Temp 98.8°F | Ht 62.0 in | Wt 224.6 lb

## 2022-03-29 DIAGNOSIS — Z111 Encounter for screening for respiratory tuberculosis: Secondary | ICD-10-CM | POA: Diagnosis not present

## 2022-03-29 LAB — TB SKIN TEST
Induration: 0 mm
TB Skin Test: NEGATIVE

## 2022-03-29 NOTE — Progress Notes (Signed)
Agree with documentation as below.  ___________________________________________ Lindsay Amezcua L. Zorianna Taliaferro, DNP, APRN, FNP-BC Primary Care and Sports Medicine [facility]  

## 2022-03-29 NOTE — Progress Notes (Signed)
Patient is here for a PPD reading.   Results were Negative 0 MM.

## 2022-03-31 LAB — CYTOLOGY - PAP
Comment: NEGATIVE
Diagnosis: NEGATIVE
High risk HPV: NEGATIVE

## 2022-04-12 DIAGNOSIS — I120 Hypertensive chronic kidney disease with stage 5 chronic kidney disease or end stage renal disease: Secondary | ICD-10-CM | POA: Diagnosis not present

## 2022-04-12 DIAGNOSIS — Z992 Dependence on renal dialysis: Secondary | ICD-10-CM | POA: Diagnosis not present

## 2022-04-12 DIAGNOSIS — Z01818 Encounter for other preprocedural examination: Secondary | ICD-10-CM | POA: Diagnosis not present

## 2022-04-12 DIAGNOSIS — N186 End stage renal disease: Secondary | ICD-10-CM | POA: Diagnosis not present

## 2022-05-17 DIAGNOSIS — Z79899 Other long term (current) drug therapy: Secondary | ICD-10-CM | POA: Diagnosis not present

## 2022-05-17 DIAGNOSIS — N179 Acute kidney failure, unspecified: Secondary | ICD-10-CM | POA: Diagnosis not present

## 2022-06-01 ENCOUNTER — Encounter: Payer: Self-pay | Admitting: Medical-Surgical

## 2022-06-01 MED ORDER — AMLODIPINE BESYLATE 10 MG PO TABS: 10.0000 mg | ORAL_TABLET | Freq: Every day | ORAL | 1 refills | Status: DC

## 2022-06-26 DIAGNOSIS — N185 Chronic kidney disease, stage 5: Secondary | ICD-10-CM | POA: Diagnosis not present

## 2022-06-28 DIAGNOSIS — N02B1 Recurrent and persistent immunoglobulin A nephropathy with glomerular lesion: Secondary | ICD-10-CM | POA: Diagnosis not present

## 2022-06-28 DIAGNOSIS — N186 End stage renal disease: Secondary | ICD-10-CM | POA: Diagnosis not present

## 2022-06-29 DIAGNOSIS — N186 End stage renal disease: Secondary | ICD-10-CM | POA: Insufficient documentation

## 2022-06-29 DIAGNOSIS — Z940 Kidney transplant status: Secondary | ICD-10-CM | POA: Insufficient documentation

## 2022-06-29 DIAGNOSIS — D849 Immunodeficiency, unspecified: Secondary | ICD-10-CM | POA: Insufficient documentation

## 2022-06-29 HISTORY — DX: End stage renal disease: N18.6

## 2022-07-20 DIAGNOSIS — Z940 Kidney transplant status: Secondary | ICD-10-CM | POA: Diagnosis not present

## 2022-07-25 DIAGNOSIS — Z940 Kidney transplant status: Secondary | ICD-10-CM | POA: Diagnosis not present

## 2022-07-25 DIAGNOSIS — Z79899 Other long term (current) drug therapy: Secondary | ICD-10-CM | POA: Diagnosis not present

## 2022-07-25 DIAGNOSIS — D849 Immunodeficiency, unspecified: Secondary | ICD-10-CM | POA: Diagnosis not present

## 2022-07-25 DIAGNOSIS — R808 Other proteinuria: Secondary | ICD-10-CM | POA: Diagnosis not present

## 2022-07-25 DIAGNOSIS — N02B9 Other recurrent and persistent immunoglobulin A nephropathy: Secondary | ICD-10-CM | POA: Diagnosis not present

## 2022-07-25 DIAGNOSIS — I10 Essential (primary) hypertension: Secondary | ICD-10-CM | POA: Diagnosis not present

## 2022-08-10 DIAGNOSIS — Z940 Kidney transplant status: Secondary | ICD-10-CM | POA: Diagnosis not present

## 2022-08-10 DIAGNOSIS — Z79899 Other long term (current) drug therapy: Secondary | ICD-10-CM | POA: Diagnosis not present

## 2022-08-17 DIAGNOSIS — R87623 High grade squamous intraepithelial lesion on cytologic smear of vagina (HGSIL): Secondary | ICD-10-CM | POA: Diagnosis not present

## 2022-08-17 DIAGNOSIS — Z940 Kidney transplant status: Secondary | ICD-10-CM | POA: Diagnosis not present

## 2022-08-17 DIAGNOSIS — D849 Immunodeficiency, unspecified: Secondary | ICD-10-CM | POA: Diagnosis not present

## 2022-08-17 DIAGNOSIS — N02B9 Other recurrent and persistent immunoglobulin A nephropathy: Secondary | ICD-10-CM | POA: Diagnosis not present

## 2022-08-17 DIAGNOSIS — N186 End stage renal disease: Secondary | ICD-10-CM | POA: Diagnosis not present

## 2022-09-27 ENCOUNTER — Ambulatory Visit (INDEPENDENT_AMBULATORY_CARE_PROVIDER_SITE_OTHER): Payer: Medicare Other | Admitting: Medical-Surgical

## 2022-09-27 ENCOUNTER — Encounter: Payer: Self-pay | Admitting: Medical-Surgical

## 2022-09-27 VITALS — BP 127/81 | HR 86 | Resp 20 | Ht 62.0 in | Wt 230.0 lb

## 2022-09-27 DIAGNOSIS — F418 Other specified anxiety disorders: Secondary | ICD-10-CM

## 2022-09-27 DIAGNOSIS — Z940 Kidney transplant status: Secondary | ICD-10-CM | POA: Diagnosis not present

## 2022-09-27 DIAGNOSIS — I150 Renovascular hypertension: Secondary | ICD-10-CM | POA: Diagnosis not present

## 2022-09-27 DIAGNOSIS — R7303 Prediabetes: Secondary | ICD-10-CM | POA: Diagnosis not present

## 2022-09-27 NOTE — Progress Notes (Signed)
Established Patient Office Visit  Subjective   Patient ID: Lindsay Hall, female   DOB: 27-Apr-1986 Age: 37 y.o. MRN: [id number]   Chief Complaint  Patient presents with   Follow-up   Hypertension    HPI Pleasant 37 year old female presenting today for follow up. She reports happy news of getting a kidney transplant on 06/28/22. She has been following up with Nephrology and Renal Transplant as instructed. She has been doing well overall but struggling to get decent sleep. She is tired all the time and has no energy. This has left her with little energy or desire to do things. Has been limited in her ability to do things physically due to transplant restrictions but is starting to progress a bit.  Currently taking amlodipine 10 mg daily and carvedilol 3.125 mg twice daily as prescribed, tolerating well without side effects.  Has been keeping an eye on her blood pressure and reports that it has been looking good overall.  Following a low-sodium diet. Denies CP, SOB, palpitations, lower extremity edema, dizziness, headaches, or vision changes.   Objective:    Vitals:   09/27/22 0824  BP: 127/81  Pulse: 86  Resp: 20  Height: 5\' 2"  (1.575 m)  Weight: 230 lb (104.3 kg)  SpO2: 100%  BMI (Calculated): 42.06    Physical Exam Vitals and nursing note reviewed.  Constitutional:      General: She is not in acute distress.    Appearance: Normal appearance. She is obese. She is not ill-appearing.  HENT:     Head: Normocephalic and atraumatic.  Cardiovascular:     Rate and Rhythm: Normal rate and regular rhythm.     Pulses: Normal pulses.     Heart sounds: Normal heart sounds.  Pulmonary:     Effort: Pulmonary effort is normal. No respiratory distress.     Breath sounds: Normal breath sounds. No wheezing, rhonchi or rales.  Skin:    General: Skin is warm and dry.  Neurological:     Mental Status: She is alert and oriented to person, place, and time.  Psychiatric:        Mood and  Affect: Mood normal.        Behavior: Behavior normal.        Thought Content: Thought content normal.        Judgment: Judgment normal.   No results found for this or any previous visit (from the past 24 hour(s)).     The ASCVD Risk score (Arnett DK, et al., 2019) failed to calculate for the following reasons:   The 2019 ASCVD risk score is only valid for ages 6 to 67   Assessment & Plan:   1. Renovascular hypertension Blood pressure at goal today.  Continue amlodipine and carvedilol as prescribed.  Monitor blood pressure at home home with a goal of 130/80 or less.  Continue low-sodium diet.  Increase physical activity as tolerated.  Aim for weight loss to healthy weight.  2. Status post kidney transplant Under the care of of renal transplant and nephrology.  3. Prediabetes Most recent hemoglobin A1c checked in December with a result of 6.1%.  Still in the prediabetic category.  Recommend dietary modifications, and weight loss.  4. Depression with anxiety Unfortunately, she is with the physical limitations imposed since having her transplant.  Loss of interest, lack of motivation, and oversleeping or not helping the situation.  She is not at a point that she feels she needs medication at this point.  Also  declined a counseling referral.  She has several friends and family members that are serving as support right now and will let me know if she changes her mind on these things.  Return in about 6 months (around 03/28/2023) for chronic disease follow up.  ___________________________________________ Clearnce Sorrel, DNP, APRN, FNP-BC Primary Care and [place]

## 2022-10-01 IMAGING — CR DG CHEST 2V
2 series · 2 of 2 positions shown · non-contrast
Comparison: 07/12/2020

CLINICAL DATA: Follow-up pneumonia, shortness of breath, cough

EXAM:
CHEST - 2 VIEW

[w chest pa]
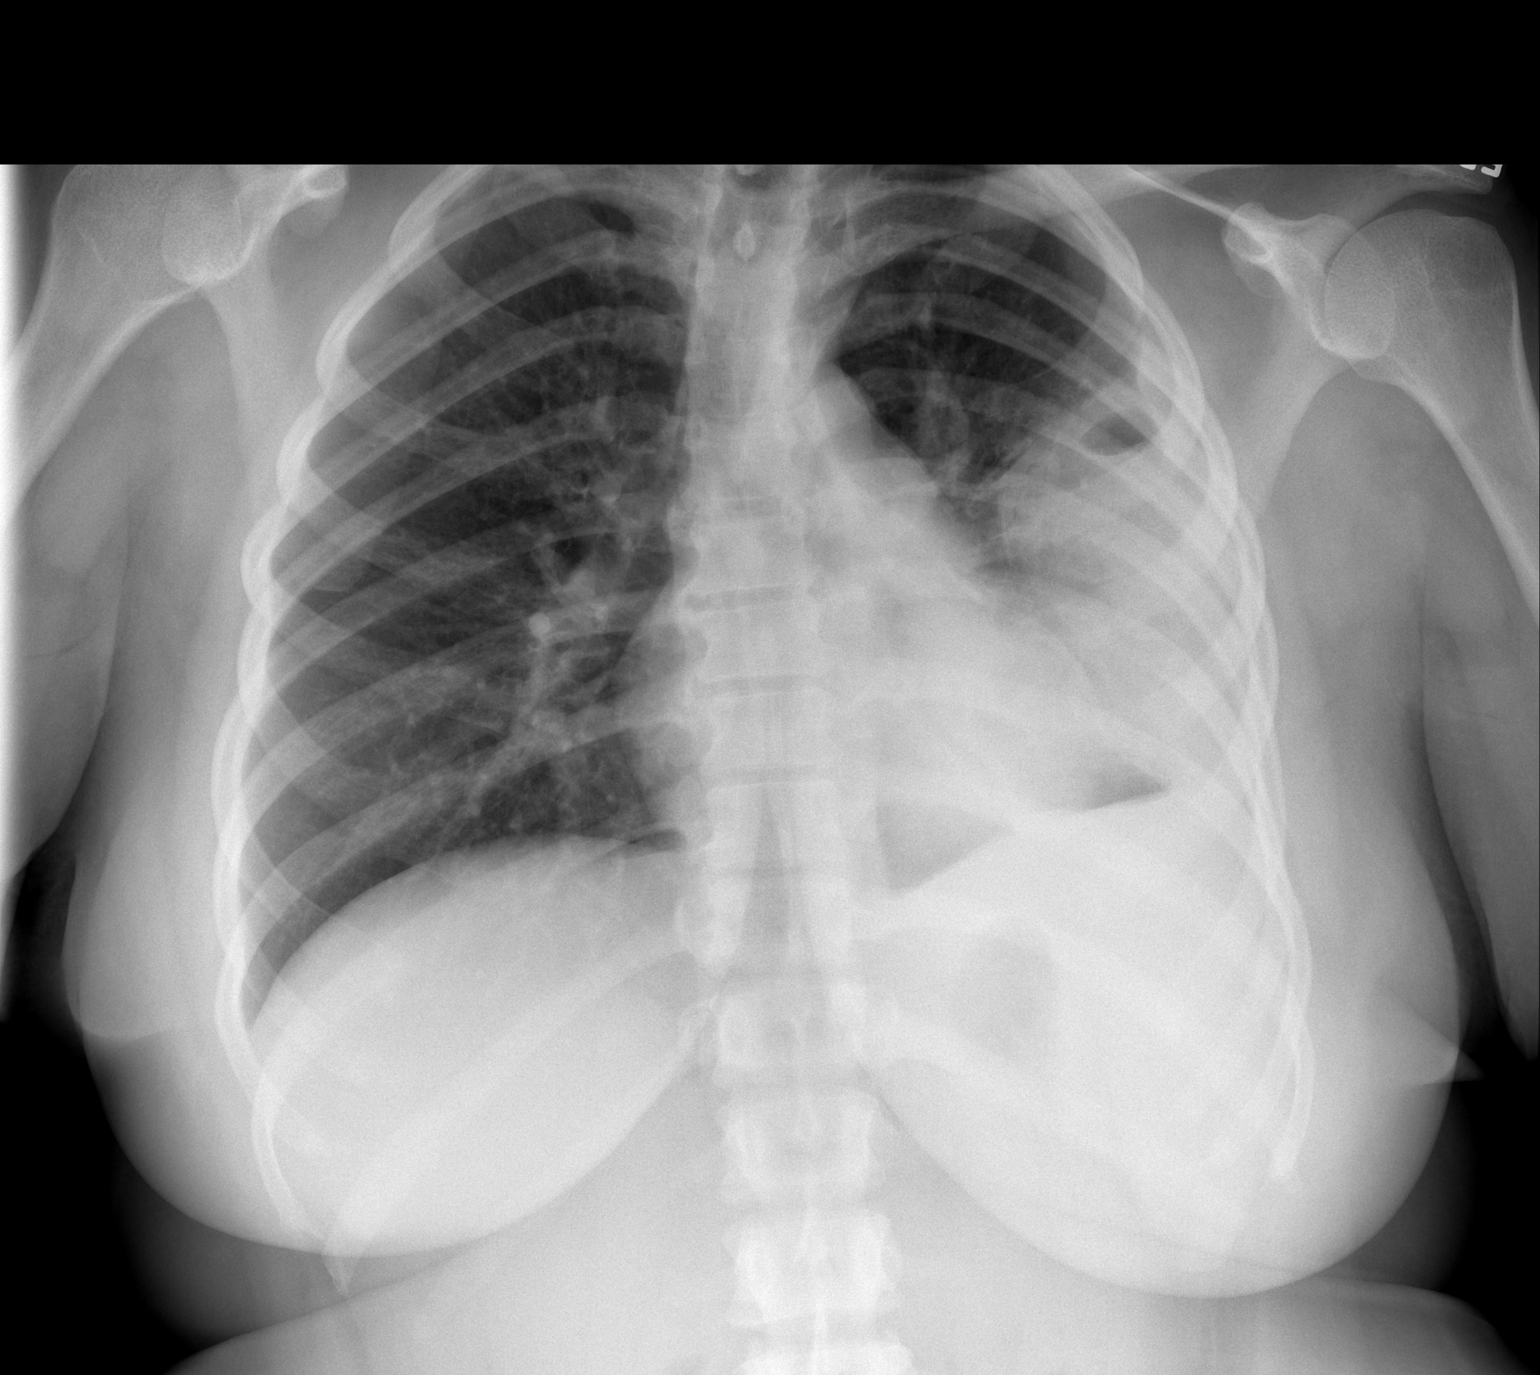

[w chest lat]
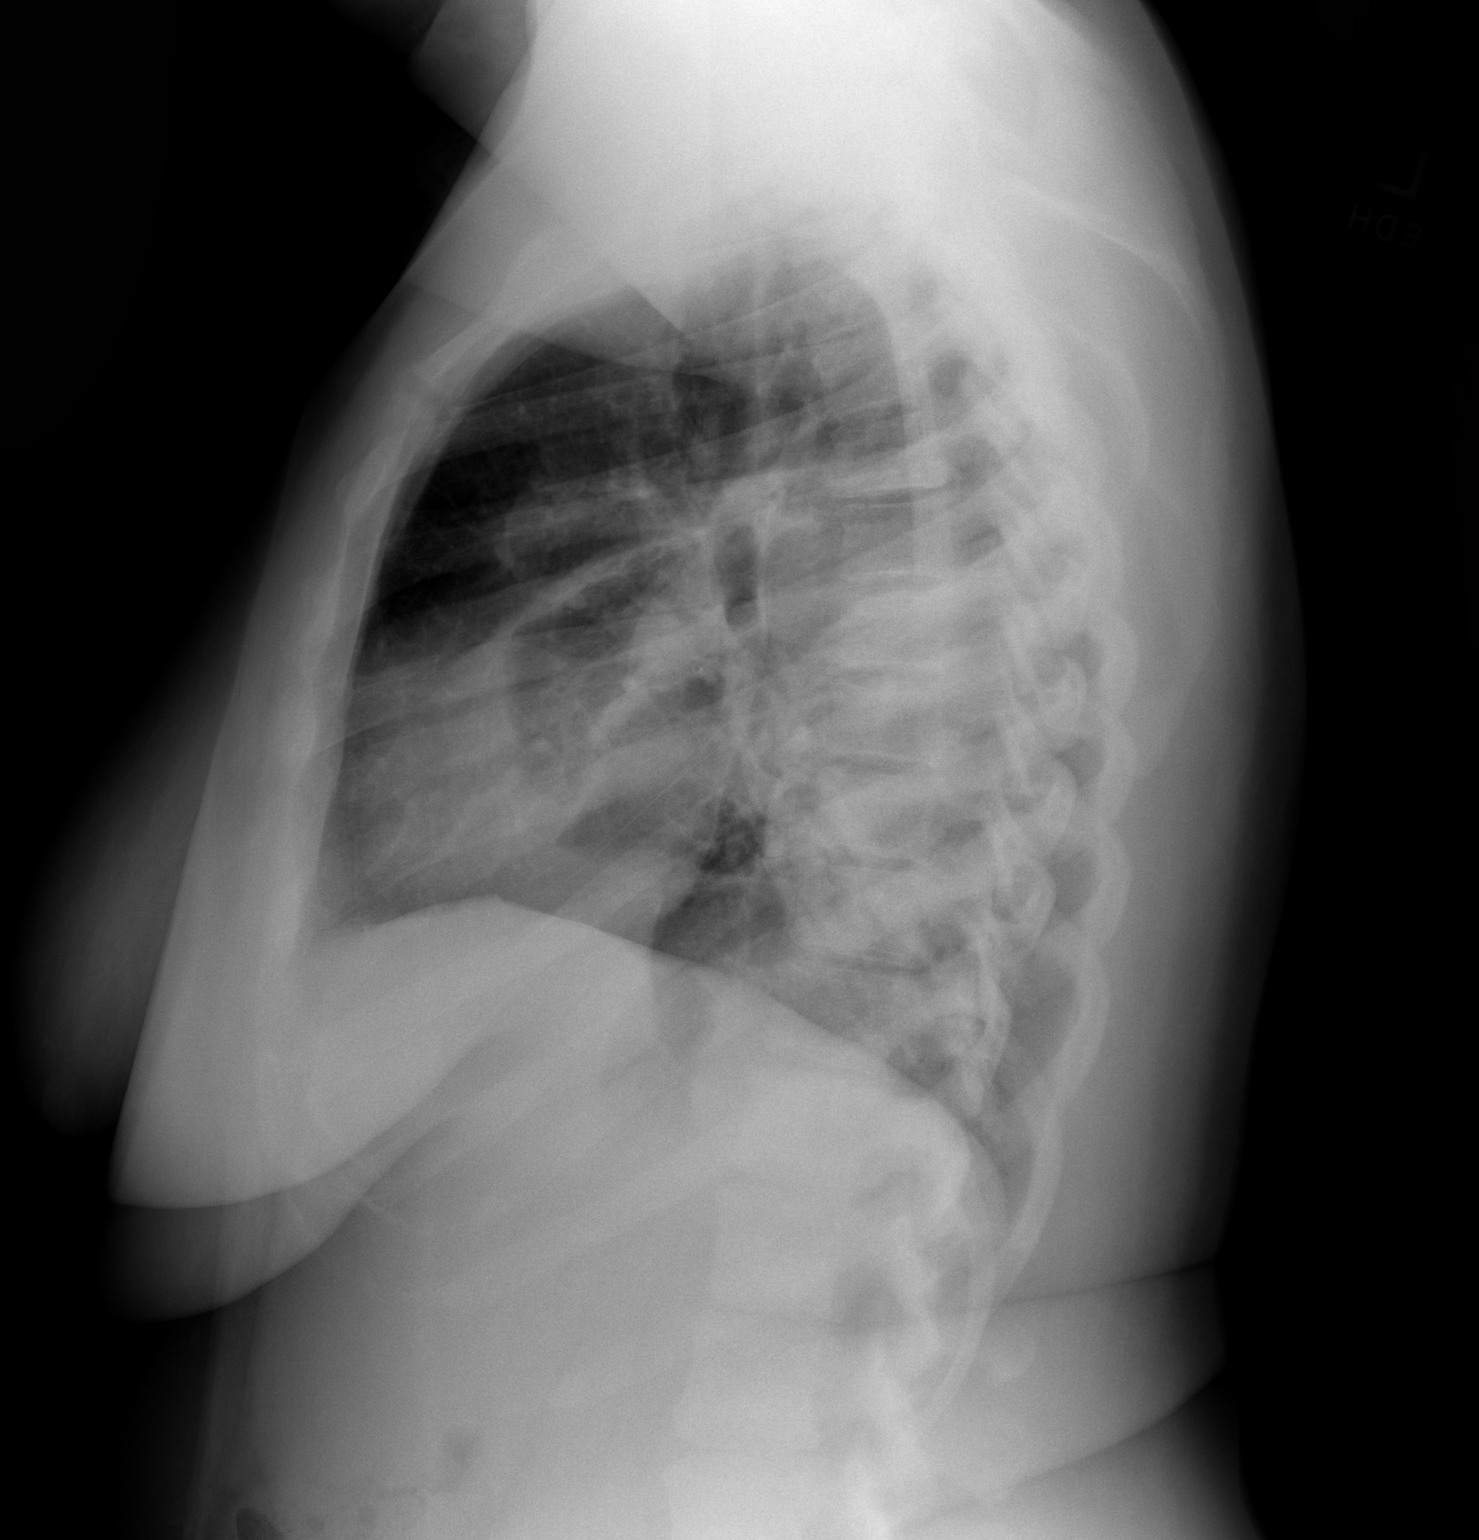

[2 of 2 positions shown; findings below may reference images not displayed]

FINDINGS: Area of continued dense consolidation in the left mid and lower
lung. There is now an area of cavitation superiorly with gas noted.
Possible small adjacent loculated left effusion. Right lung clear.
Heart is normal size.
IMPRESSION: Continued dense area of consolidation in the left mid and lower lung
now with small area of cavitation. This could reflect lung abscess.
Adjacent small left pleural effusion. Given the persistence over
time, this could be further evaluated with chest CT with IV
contrast.

## 2022-10-01 IMAGING — CT CT CHEST W/O CM
2 of 4 series · 15 of 36 positions shown, 18 images · non-contrast
Comparison: None.

CLINICAL DATA: Empyema

EXAM:
CT CHEST WITHOUT CONTRAST
TECHNIQUE: Multidetector CT imaging of the chest was performed following the
standard protocol without IV contrast.

[Series 3: chest wo · axial · 0.69mm/px · z∈[+1375,+1565]mm · 12 of 113 slices shown, 15 images]
[im 9/113  mediastinal]
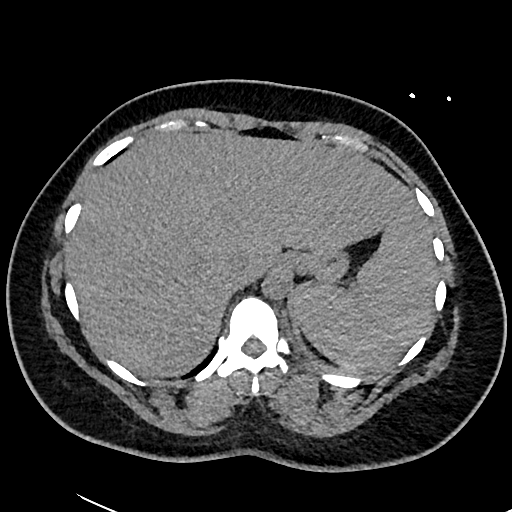
[im 9/113  lung]
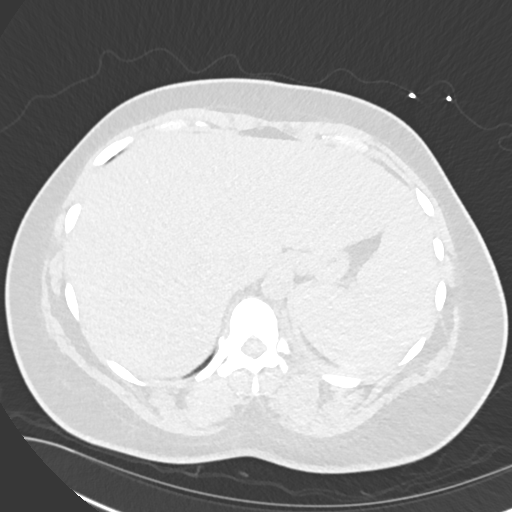
[im 18/113  lung]
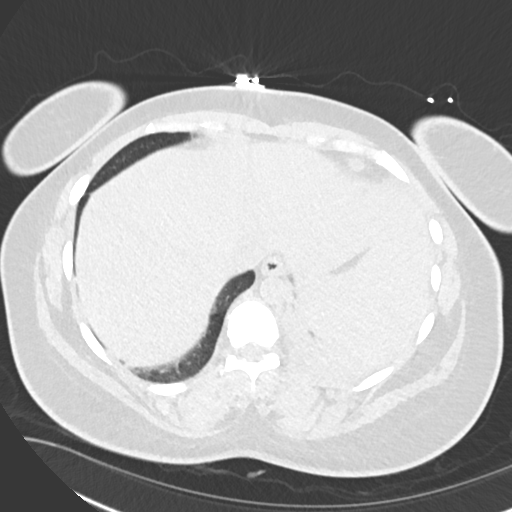
[im 26/113  lung]
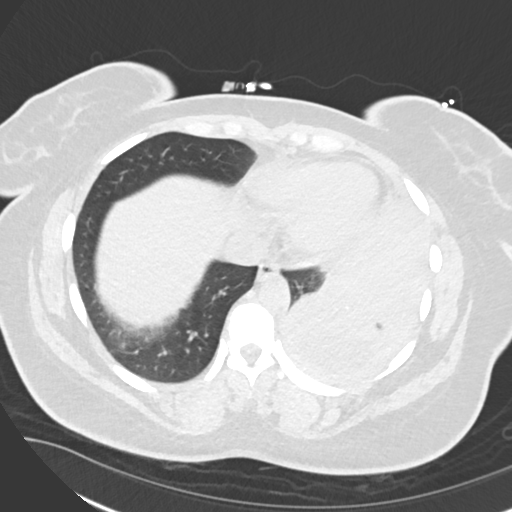
[im 35/113  lung]
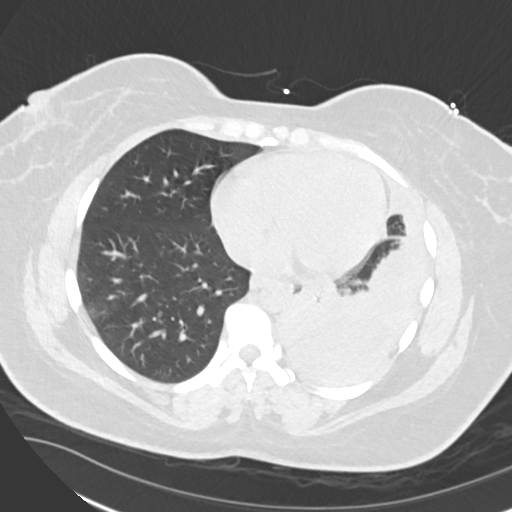
[im 44/113  mediastinal]
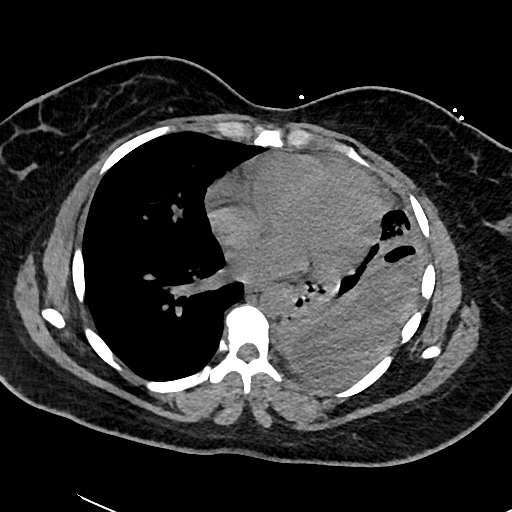
[im 44/113  lung]
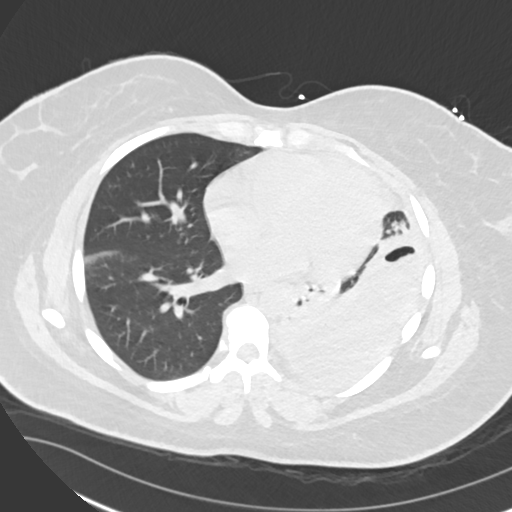
[im 52/113  lung]
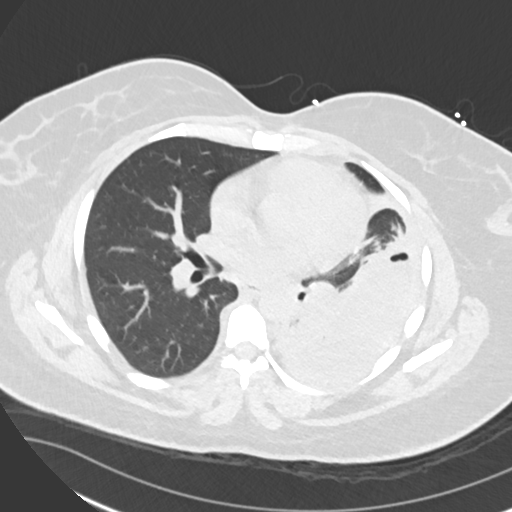
[im 61/113  lung]
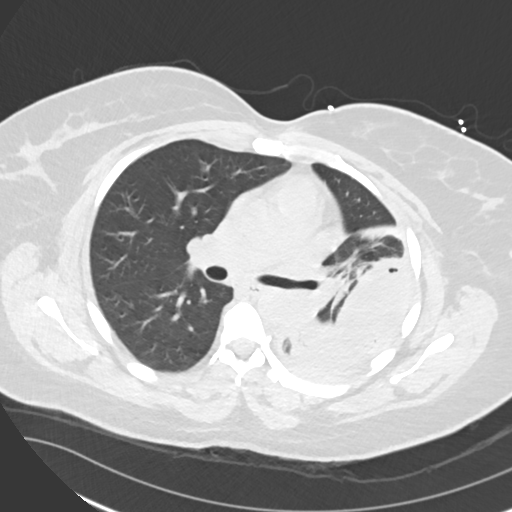
[im 69/113  lung]
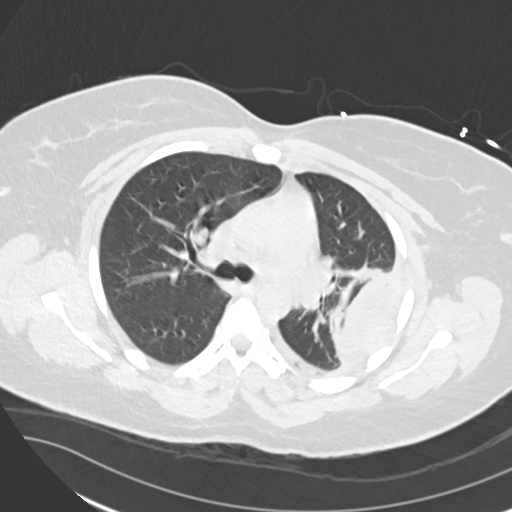
[im 78/113  mediastinal]
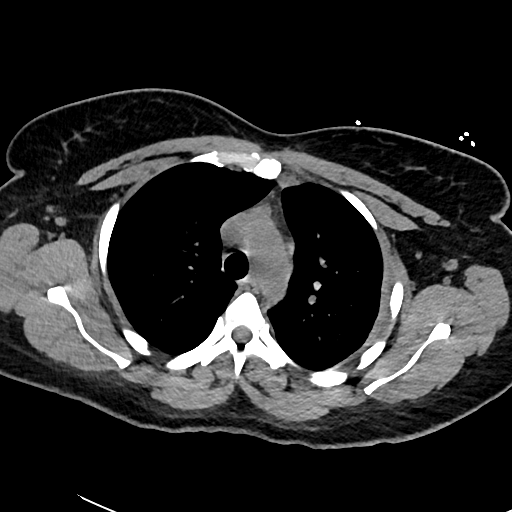
[im 78/113  lung]
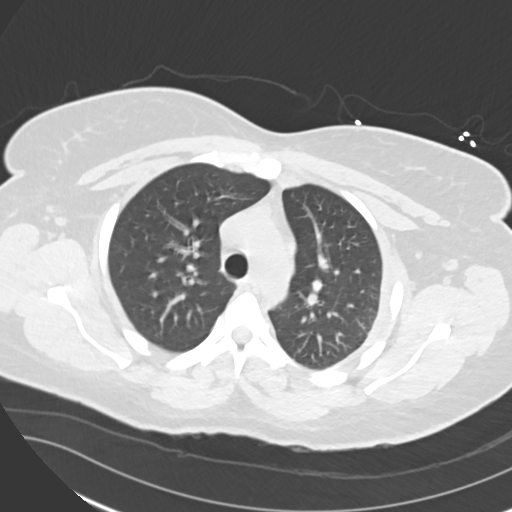
[im 87/113  lung]
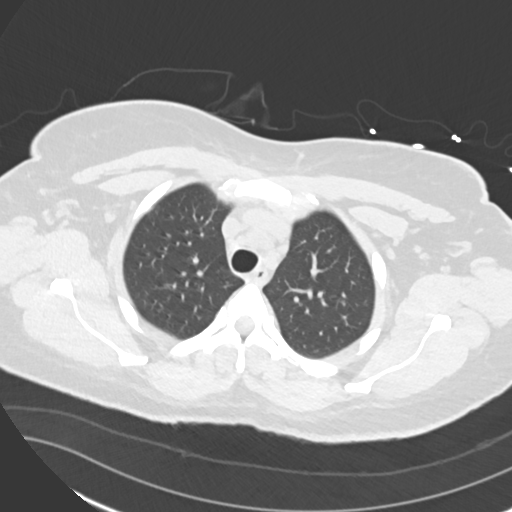
[im 95/113  lung]
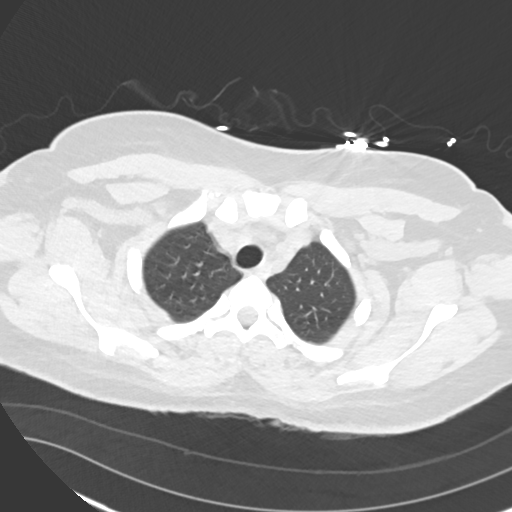
[im 104/113  lung]
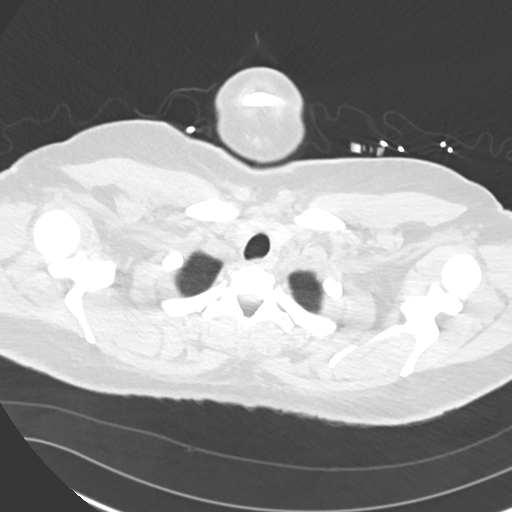

[Series 5: cor · coronal · 0.44mm/px · 3 of 135 slices shown]
[im 27/135  lung]
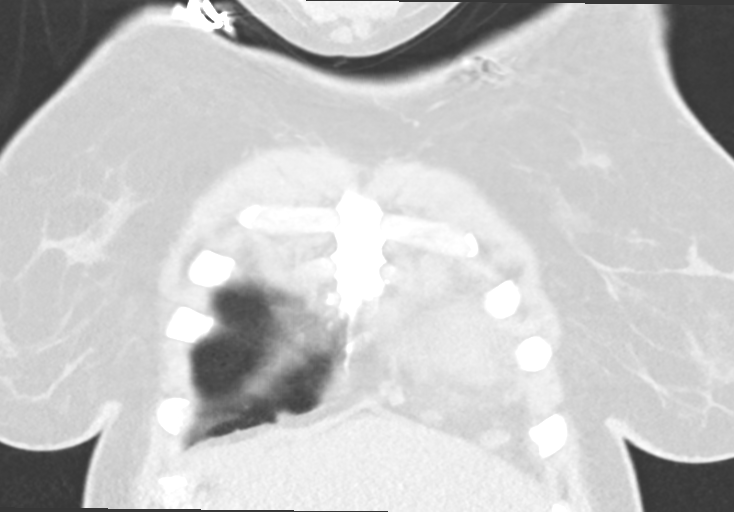
[im 54/135  lung]
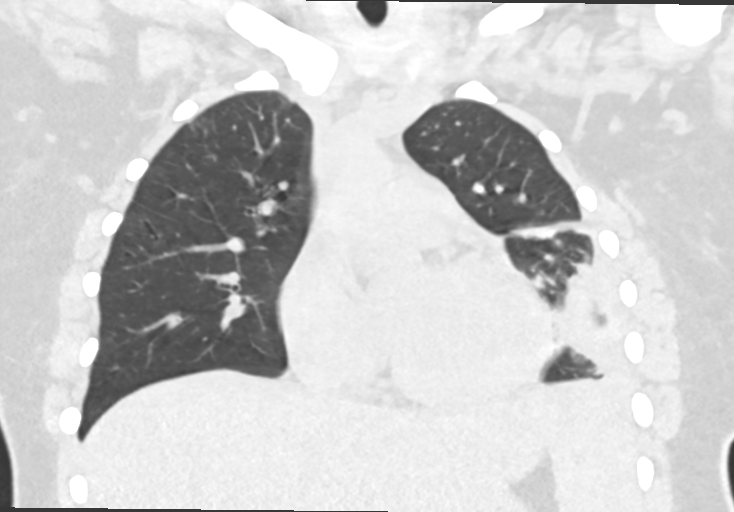
[im 81/135  lung]
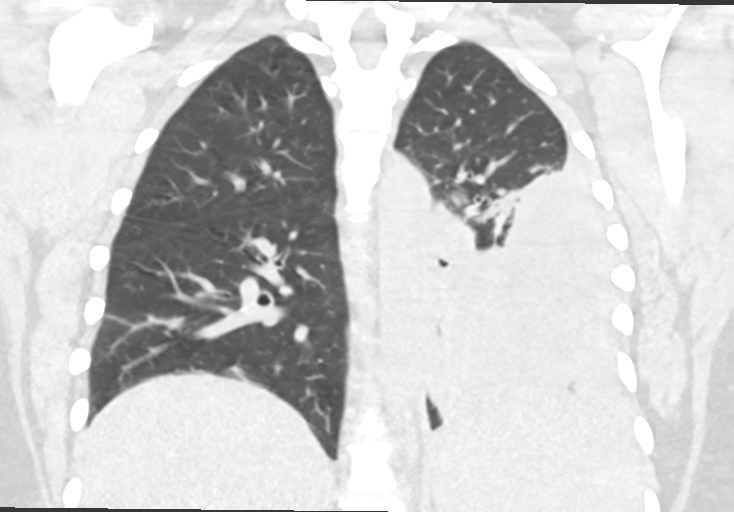

[15 of 36 positions shown; findings below may reference images not displayed]

FINDINGS: Cardiovascular: The heart size is unremarkable. There is no
significant pericardial effusion.

Mediastinum/Nodes:

--there is mild mediastinal adenopathy.

-- No hilar lymphadenopathy.

-- No axillary lymphadenopathy.

-- No supraclavicular lymphadenopathy.

-- Normal thyroid gland where visualized.

-  Unremarkable esophagus.

Lungs/Pleura: Evaluation of the lung fields is limited by lack of IV
contrast. There is a large, loculated pleural fluid collection on
the left, likely representing an empyema. There is pleural
thickening and pockets of gas within this collection. There is
significant compression of the left lower lobe. The trachea is
unremarkable.

Upper Abdomen: There is no significant abnormality in the upper
abdomen.

Musculoskeletal: No chest wall abnormality. No bony spinal canal
stenosis.
IMPRESSION: 1. Evaluation limited by lack of IV contrast.
2. Findings most consistent with a large left-sided empyema as
detailed above.

## 2022-10-02 IMAGING — DX DG CHEST 1V PORT
1 series · 1 of 1 positions shown · non-contrast
Comparison: Chest radiograph and chest CT July 27, 2020

CLINICAL DATA: Chest tube and central catheter placements

EXAM:
PORTABLE CHEST 1 VIEW

[chest ap]
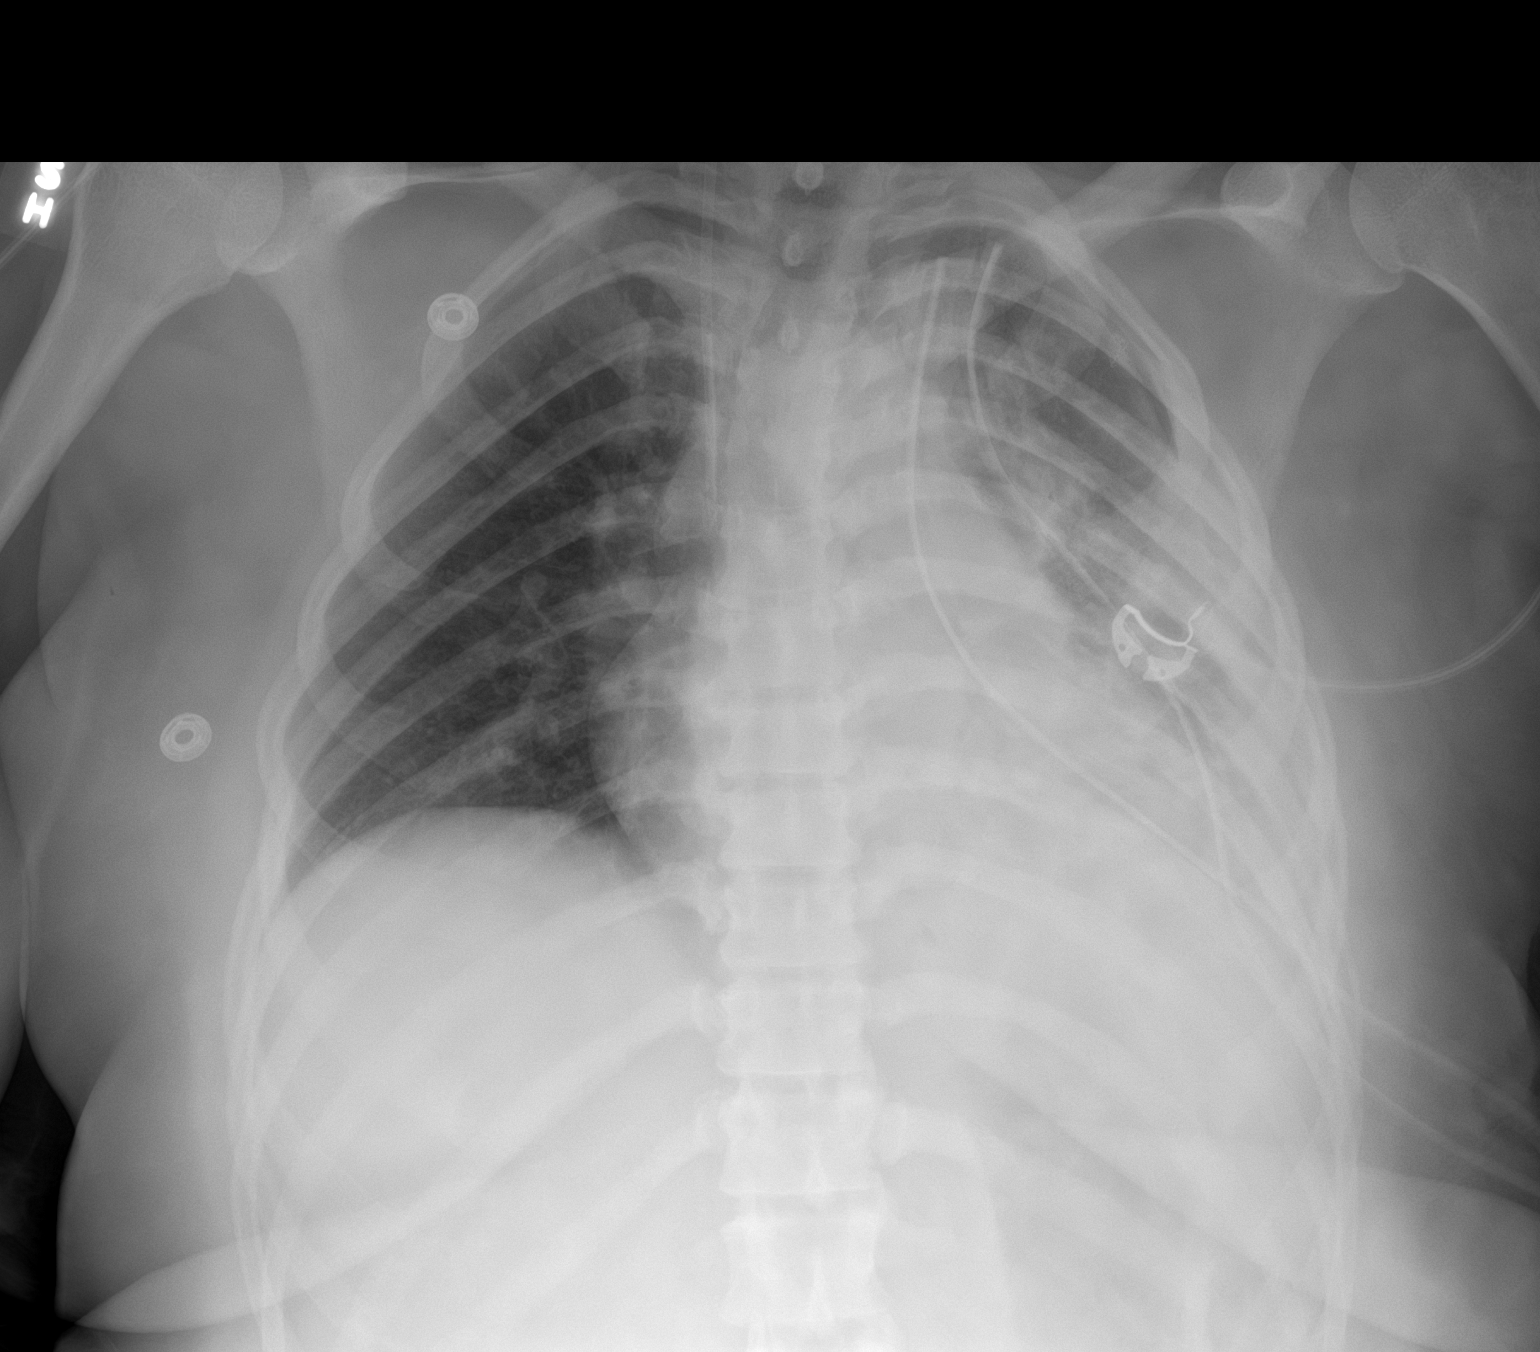

[1 of 1 positions shown; findings below may reference images not displayed]

FINDINGS: There are two chest tubes on the left with small apicolateral
pneumothorax on the left. No tension component. There is less
effusion and consolidation on the left after chest tube placement.
There is a central catheter with tip in superior vena cava. Right
lung is clear. Heart is upper normal in size with pulmonary
vascularity normal. No adenopathy. No bone lesions.
IMPRESSION: Chest tubes on the left with small apicolateral pneumothorax on the
left. No tension component. There is overall less effusion and
consolidation on the left following chest tube placements.

Central catheter tip in superior vena cava. Right lung clear. Heart
upper normal in size.

## 2022-10-03 IMAGING — DX DG CHEST 1V PORT
1 series · 1 of 1 positions shown · non-contrast
Comparison: Chest radiograph 07/28/2020.

CLINICAL DATA: History of chest tube.  Empyema the pleural space.

EXAM:
PORTABLE CHEST 1 VIEW

[chest ap]
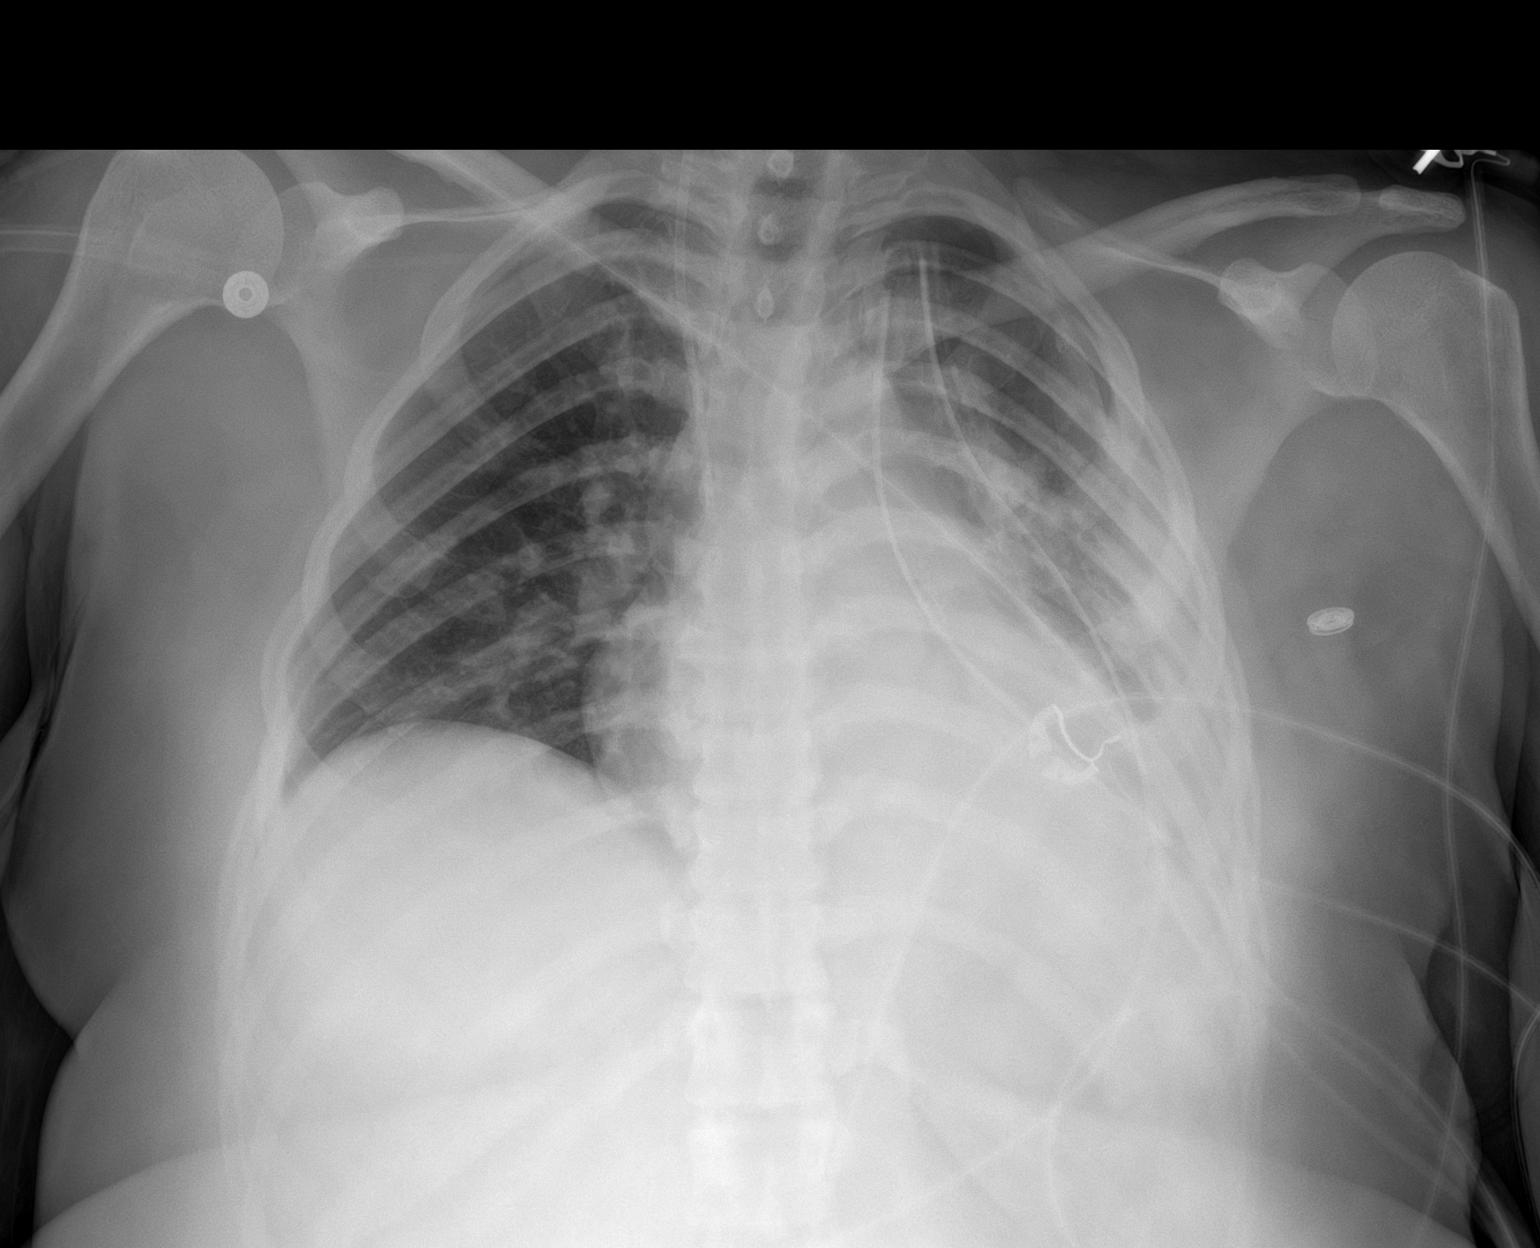

[1 of 1 positions shown; findings below may reference images not displayed]

FINDINGS: Right IJ central venous catheter tip projects over the superior vena
cava. Left chest tubes are stable in position. Persistent small left
lateral pneumothorax. Stable cardiac and mediastinal contours.
Similar-appearing left mid and lower lung airspace opacities. Small
amount of left pleural fluid. Clear right lung. Osseous structures
unremarkable.
IMPRESSION: Persistent small left lateral pneumothorax with chest tubes in
place. Persistent left mid and lower lung opacities with small
amount of left pleural fluid.

## 2022-11-21 ENCOUNTER — Ambulatory Visit (INDEPENDENT_AMBULATORY_CARE_PROVIDER_SITE_OTHER): Payer: Medicare Other | Admitting: Medical-Surgical

## 2022-11-21 ENCOUNTER — Encounter: Payer: Self-pay | Admitting: Medical-Surgical

## 2022-11-21 VITALS — BP 134/81 | HR 77 | Resp 20 | Ht 62.0 in | Wt 232.9 lb

## 2022-11-21 DIAGNOSIS — Z3009 Encounter for other general counseling and advice on contraception: Secondary | ICD-10-CM | POA: Diagnosis not present

## 2022-11-21 DIAGNOSIS — Z3046 Encounter for surveillance of implantable subdermal contraceptive: Secondary | ICD-10-CM | POA: Diagnosis not present

## 2022-11-21 NOTE — Progress Notes (Signed)
   Established Patient Office Visit  Subjective   Patient ID: Lindsay Hall, female   DOB: [birth year] Age: 37 y.o. MRN: [id number]   Chief Complaint  Patient presents with   NEXPLANON REMOVAL    HPI Pleasant 37 year old female presenting today for Nexplanon removal.  Her Nexplanon has been in place for the last 3 years.  She notes having to periods in January and 2 in February.  Would like to get it removed today but is not interested in having it replaced just yet.  Would like to give her body some time to "reset" before restarting any birth control.   Objective:    Vitals:   11/21/22 0826  BP: 134/81  Pulse: 77  Resp: 20  Height: 5\' 2"  (1.575 m)  Weight: 232 lb 14.4 oz (105.6 kg)  SpO2: 100%  BMI (Calculated): 42.59    Physical Exam Vitals reviewed.  Constitutional:      General: She is not in acute distress.    Appearance: Normal appearance. She is not ill-appearing.  HENT:     Head: Normocephalic and atraumatic.  Cardiovascular:     Rate and Rhythm: Normal rate and regular rhythm.  Pulmonary:     Effort: Pulmonary effort is normal. No respiratory distress.     Breath sounds: Normal breath sounds.  Skin:    General: Skin is warm and dry.     Findings: Lesion: .diagm.  Neurological:     Mental Status: She is alert and oriented to person, place, and time.  Psychiatric:        Mood and Affect: Mood normal.        Behavior: Behavior normal.        Thought Content: Thought content normal.        Judgment: Judgment normal.   No results found for this or any previous visit (from the past 24 hour(s)).     The ASCVD Risk score (Arnett DK, et al., 2019) failed to calculate for the following reasons:   The 2019 ASCVD risk score is only valid for ages 4 to 21   Assessment & Plan:   1. Nexplanon removal PROCEDURE NOTE: [place]  Patient given informed consent and signed copy in the chart for both procedures. an appropriate time out was been taken. Left  arm area prepped and draped in the usual sterile fashion. Three cc of 1% lidocaine with epinephrine used for local anesthesia. A small stab incision was made close to the nexplanon with scalpel. Hemostats were used to withdraw the Nexplanon. Bleeding minimal.  Incision covered with steri-strip and pressure dressing.  There were no complications and patient tolerated well.  Patient given follow up instructions should she experience redness, swelling at sight or fever in the next 24 hours.  2. Birth control counseling Reviewed various options.  She has had Nexplanon before and did well with it.  Unsure if she wants to have another Nexplanon placed or if she would like to pursue other options.  We did briefly discuss more permanent options including IUD and endometrial ablation.  She is interested in more information about endometrial ablation so we are referring to GYN today.  Return if symptoms worsen or fail to improve.  ___________________________________________ Clearnce Sorrel, DNP, APRN, FNP-BC [facility] and [place]

## 2022-12-07 DIAGNOSIS — Z5181 Encounter for therapeutic drug level monitoring: Secondary | ICD-10-CM | POA: Diagnosis not present

## 2022-12-07 DIAGNOSIS — Z79621 Long term (current) use of calcineurin inhibitor: Secondary | ICD-10-CM | POA: Diagnosis not present

## 2022-12-07 DIAGNOSIS — Z7969 Long term (current) use of other immunomodulators and immunosuppressants: Secondary | ICD-10-CM | POA: Diagnosis not present

## 2022-12-07 DIAGNOSIS — D649 Anemia, unspecified: Secondary | ICD-10-CM | POA: Diagnosis not present

## 2022-12-07 DIAGNOSIS — N02B9 Other recurrent and persistent immunoglobulin A nephropathy: Secondary | ICD-10-CM | POA: Diagnosis not present

## 2022-12-07 DIAGNOSIS — I10 Essential (primary) hypertension: Secondary | ICD-10-CM | POA: Diagnosis not present

## 2022-12-07 DIAGNOSIS — Z4822 Encounter for aftercare following kidney transplant: Secondary | ICD-10-CM | POA: Diagnosis not present

## 2022-12-07 DIAGNOSIS — Z940 Kidney transplant status: Secondary | ICD-10-CM | POA: Diagnosis not present

## 2022-12-26 DIAGNOSIS — J019 Acute sinusitis, unspecified: Secondary | ICD-10-CM | POA: Diagnosis not present

## 2022-12-26 DIAGNOSIS — H6121 Impacted cerumen, right ear: Secondary | ICD-10-CM | POA: Diagnosis not present

## 2022-12-26 DIAGNOSIS — Z940 Kidney transplant status: Secondary | ICD-10-CM | POA: Diagnosis not present

## 2022-12-26 DIAGNOSIS — D84821 Immunodeficiency due to drugs: Secondary | ICD-10-CM | POA: Diagnosis not present

## 2022-12-26 DIAGNOSIS — T380X5A Adverse effect of glucocorticoids and synthetic analogues, initial encounter: Secondary | ICD-10-CM | POA: Diagnosis not present

## 2023-01-04 DIAGNOSIS — Z940 Kidney transplant status: Secondary | ICD-10-CM | POA: Diagnosis not present

## 2023-01-04 DIAGNOSIS — Z862 Personal history of diseases of the blood and blood-forming organs and certain disorders involving the immune mechanism: Secondary | ICD-10-CM | POA: Diagnosis not present

## 2023-01-04 DIAGNOSIS — Z79899 Other long term (current) drug therapy: Secondary | ICD-10-CM | POA: Diagnosis not present

## 2023-01-15 ENCOUNTER — Encounter: Payer: Self-pay | Admitting: Obstetrics & Gynecology

## 2023-01-15 ENCOUNTER — Ambulatory Visit (INDEPENDENT_AMBULATORY_CARE_PROVIDER_SITE_OTHER): Payer: 59 | Admitting: Obstetrics & Gynecology

## 2023-01-15 VITALS — BP 138/83 | HR 80 | Ht 62.0 in | Wt 228.0 lb

## 2023-01-15 DIAGNOSIS — Z3202 Encounter for pregnancy test, result negative: Secondary | ICD-10-CM | POA: Diagnosis not present

## 2023-01-15 DIAGNOSIS — Z3009 Encounter for other general counseling and advice on contraception: Secondary | ICD-10-CM

## 2023-01-15 DIAGNOSIS — Z30017 Encounter for initial prescription of implantable subdermal contraceptive: Secondary | ICD-10-CM

## 2023-01-15 LAB — POCT URINE PREGNANCY: Preg Test, Ur: NEGATIVE

## 2023-01-15 MED ORDER — ETONOGESTREL 68 MG ~~LOC~~ IMPL
68.0000 mg | DRUG_IMPLANT | Freq: Once | SUBCUTANEOUS | Status: AC
Start: 2023-01-15 — End: 2023-01-15
  Administered 2023-01-15: 68 mg via SUBCUTANEOUS

## 2023-01-15 NOTE — Progress Notes (Signed)
   Subjective:    Patient ID: Lindsay Hall, female    DOB: 10/01/1985, 37 y.o.   MRN: [id number]  HPI  Pt had nexplanon removed due to menomet.  Would like replaced.    Review of Systems  Constitutional: Negative.   Respiratory: Negative.    Cardiovascular: Negative.   Gastrointestinal: Negative.   Genitourinary: Negative.        Objective:   Physical Exam Vitals reviewed.  Constitutional:      General: She is not in acute distress.    Appearance: She is well-developed.  HENT:     Head: Normocephalic and atraumatic.  Eyes:     Conjunctiva/sclera: Conjunctivae normal.  Cardiovascular:     Rate and Rhythm: Normal rate.  Pulmonary:     Effort: Pulmonary effort is normal.  Skin:    General: Skin is warm and dry.  Neurological:     Mental Status: She is alert and oriented to person, place, and time.  Psychiatric:        Mood and Affect: Mood normal.    Vitals:   01/15/23 1341  BP: 138/83  Pulse: 80  Weight: 228 lb (103.4 kg)  Height: 5\' 2"  (1.575 m)      Assessment & Plan:  37 yo female desiring Nexplanon  UPT was negative. Consent was signed. Time out procedure was performed.  Her left arm was prepped with betadine and infiltrated with 3 cc of 1% lidocaine. After adequate anesthesia was assured, the Nexplanon device was placed between over the triceps approximately 8-10 cm superior to the humeral medial epicondyle.  The Nexplanon was placed superficially by tenting the skin.  Neplanon was palpated in the correct position by myself and patient.  Her arm was hemostatic and was bandaged. Patient tolerated the procedure well and was given appropriate discharge instructions.

## 2023-02-01 DIAGNOSIS — Z79899 Other long term (current) drug therapy: Secondary | ICD-10-CM | POA: Diagnosis not present

## 2023-02-01 DIAGNOSIS — D849 Immunodeficiency, unspecified: Secondary | ICD-10-CM | POA: Diagnosis not present

## 2023-02-01 DIAGNOSIS — T451X5A Adverse effect of antineoplastic and immunosuppressive drugs, initial encounter: Secondary | ICD-10-CM | POA: Diagnosis not present

## 2023-02-01 DIAGNOSIS — Z940 Kidney transplant status: Secondary | ICD-10-CM | POA: Diagnosis not present

## 2023-02-01 DIAGNOSIS — R7303 Prediabetes: Secondary | ICD-10-CM | POA: Diagnosis not present

## 2023-02-01 DIAGNOSIS — I10 Essential (primary) hypertension: Secondary | ICD-10-CM | POA: Diagnosis not present

## 2023-03-01 DIAGNOSIS — Z940 Kidney transplant status: Secondary | ICD-10-CM | POA: Diagnosis not present

## 2023-03-01 DIAGNOSIS — Z79899 Other long term (current) drug therapy: Secondary | ICD-10-CM | POA: Diagnosis not present

## 2023-03-01 DIAGNOSIS — Z4822 Encounter for aftercare following kidney transplant: Secondary | ICD-10-CM | POA: Diagnosis not present

## 2023-03-19 ENCOUNTER — Telehealth: Payer: Self-pay | Admitting: Medical-Surgical

## 2023-03-19 NOTE — Telephone Encounter (Signed)
Ileene Patrick mother in law called and requested to see you as a new patient Her name is Lindsay Hall Phone number [phone]

## 2023-03-29 DIAGNOSIS — E7800 Pure hypercholesterolemia, unspecified: Secondary | ICD-10-CM | POA: Diagnosis not present

## 2023-03-29 DIAGNOSIS — D509 Iron deficiency anemia, unspecified: Secondary | ICD-10-CM | POA: Insufficient documentation

## 2023-03-29 DIAGNOSIS — Z940 Kidney transplant status: Secondary | ICD-10-CM | POA: Diagnosis not present

## 2023-03-29 DIAGNOSIS — R7303 Prediabetes: Secondary | ICD-10-CM | POA: Diagnosis not present

## 2023-03-29 DIAGNOSIS — Z4822 Encounter for aftercare following kidney transplant: Secondary | ICD-10-CM | POA: Diagnosis not present

## 2023-03-29 DIAGNOSIS — Z79899 Other long term (current) drug therapy: Secondary | ICD-10-CM | POA: Diagnosis not present

## 2023-03-29 DIAGNOSIS — D84821 Immunodeficiency due to drugs: Secondary | ICD-10-CM | POA: Diagnosis not present

## 2023-03-29 DIAGNOSIS — I10 Essential (primary) hypertension: Secondary | ICD-10-CM | POA: Diagnosis not present

## 2023-04-03 ENCOUNTER — Encounter: Payer: Self-pay | Admitting: Medical-Surgical

## 2023-04-03 ENCOUNTER — Telehealth (INDEPENDENT_AMBULATORY_CARE_PROVIDER_SITE_OTHER): Payer: 59 | Admitting: Medical-Surgical

## 2023-04-03 ENCOUNTER — Telehealth: Payer: 59 | Admitting: Medical-Surgical

## 2023-04-03 DIAGNOSIS — D509 Iron deficiency anemia, unspecified: Secondary | ICD-10-CM | POA: Diagnosis not present

## 2023-04-03 DIAGNOSIS — E1165 Type 2 diabetes mellitus with hyperglycemia: Secondary | ICD-10-CM | POA: Diagnosis not present

## 2023-04-03 DIAGNOSIS — Z7985 Long-term (current) use of injectable non-insulin antidiabetic drugs: Secondary | ICD-10-CM

## 2023-04-03 DIAGNOSIS — Z940 Kidney transplant status: Secondary | ICD-10-CM | POA: Diagnosis not present

## 2023-04-03 MED ORDER — OZEMPIC (0.25 OR 0.5 MG/DOSE) 2 MG/3ML ~~LOC~~ SOPN: PEN_INJECTOR | SUBCUTANEOUS | 1 refills | Status: DC

## 2023-04-03 NOTE — Progress Notes (Signed)
Virtual Visit via Video Note  I connected with Lindsay Hall on 04/03/23 at  2:00 PM EDT by a video enabled telemedicine application and verified that I am speaking with the correct person using two identifiers.   I discussed the limitations of evaluation and management by telemedicine and the availability of in person appointments. The patient expressed understanding and agreed to proceed.  Patient location: home Provider locations: office  Subjective:    CC: Discuss treatment for blood sugars  HPI: Pleasant 37 year old female presenting via MyChart video visit to discuss recent findings of an elevated hemoglobin A1c.  She was being evaluated by her transplant provider when they discovered her hemoglobin A1c is now 6.9% indicating progression to type 2 diabetes. She has been treated with Prednisone through the transplant process and suspects this is the reason for the worsening. Her transplant provider requested she see her PCP to discuss injections to manage sugars. Has received some information regarding diabetes management while discussing with the transplant team. Has a glucometer at home. Has an upcoming eye exam scheduled. Now taking Atorvastatin. BP and renal function being closely monitored.   Past medical history, Surgical history, Family history not pertinant except as noted below, Social history, Allergies, and medications have been entered into the medical record, reviewed, and corrections made.   Review of Systems: See HPI for pertinent positives and negatives.   Objective:    General: Speaking clearly in complete sentences without any shortness of breath.  Alert and oriented x3.  Normal judgment. No apparent acute distress.  Impression and Recommendations:    1. Type 2 diabetes mellitus with hyperglycemia, without long-term current use of insulin (HCC) Review of records show A1c on 03/29/23 was 6.9%. Discussed options for management. Metformin contraindicated. Starting  Ozempic 0.25mg  weekly x 4 weeks then increase to 0.5mg  weekly thereafter. Referring to diabetic education. Advised to notify her eye doctor that she will need a diabetic eye exam and to have that report sent to [place] for upload into the records. Discussed recommendations for checking a urine microalbumin once yearly. Ok to do this with her transplant provider or here.  - Ambulatory referral to diabetic education  I discussed the assessment and treatment plan with the patient. The patient was provided an opportunity to ask questions and all were answered. The patient agreed with the plan and demonstrated an understanding of the instructions.   The patient was advised to call back or seek an in-person evaluation if the symptoms worsen or if the condition fails to improve as anticipated.  25 minutes of non-face-to-face time was provided during this encounter.  Return in about 3 months (around 07/04/2023) for DM follow up.  Thayer Ohm, DNP, APRN, FNP-BC [facility] and Sports Medicine

## 2023-04-05 DIAGNOSIS — Z940 Kidney transplant status: Secondary | ICD-10-CM | POA: Diagnosis not present

## 2023-04-12 DIAGNOSIS — Z940 Kidney transplant status: Secondary | ICD-10-CM | POA: Diagnosis not present

## 2023-04-26 DIAGNOSIS — D84821 Immunodeficiency due to drugs: Secondary | ICD-10-CM | POA: Diagnosis not present

## 2023-04-26 DIAGNOSIS — Z4822 Encounter for aftercare following kidney transplant: Secondary | ICD-10-CM | POA: Diagnosis not present

## 2023-04-26 DIAGNOSIS — Z23 Encounter for immunization: Secondary | ICD-10-CM | POA: Diagnosis not present

## 2023-04-26 DIAGNOSIS — I10 Essential (primary) hypertension: Secondary | ICD-10-CM | POA: Diagnosis not present

## 2023-04-26 DIAGNOSIS — Z79621 Long term (current) use of calcineurin inhibitor: Secondary | ICD-10-CM | POA: Diagnosis not present

## 2023-04-26 DIAGNOSIS — Z940 Kidney transplant status: Secondary | ICD-10-CM | POA: Diagnosis not present

## 2023-04-26 DIAGNOSIS — D509 Iron deficiency anemia, unspecified: Secondary | ICD-10-CM | POA: Diagnosis not present

## 2023-05-24 DIAGNOSIS — Z940 Kidney transplant status: Secondary | ICD-10-CM | POA: Diagnosis not present

## 2023-05-28 DIAGNOSIS — E119 Type 2 diabetes mellitus without complications: Secondary | ICD-10-CM | POA: Diagnosis not present

## 2023-05-28 DIAGNOSIS — H524 Presbyopia: Secondary | ICD-10-CM | POA: Diagnosis not present

## 2023-05-28 LAB — HM DIABETES EYE EXAM

## 2023-06-04 ENCOUNTER — Encounter: Payer: Self-pay | Admitting: Medical-Surgical

## 2023-06-15 ENCOUNTER — Encounter: Payer: Self-pay | Admitting: Medical-Surgical

## 2023-06-15 ENCOUNTER — Telehealth: Payer: Self-pay

## 2023-06-15 ENCOUNTER — Ambulatory Visit: Payer: 59 | Admitting: Medical-Surgical

## 2023-06-15 VITALS — BP 158/91 | HR 85 | Resp 20 | Ht 62.0 in | Wt 227.3 lb

## 2023-06-15 DIAGNOSIS — E1165 Type 2 diabetes mellitus with hyperglycemia: Secondary | ICD-10-CM | POA: Insufficient documentation

## 2023-06-15 LAB — GLUCOSE, POCT (MANUAL RESULT ENTRY): POC Glucose: 366 mg/dL — AB (ref 70–99)

## 2023-06-15 MED ORDER — OZEMPIC (0.25 OR 0.5 MG/DOSE) 2 MG/3ML ~~LOC~~ SOPN: PEN_INJECTOR | SUBCUTANEOUS | 1 refills | Status: DC

## 2023-06-15 MED ORDER — ONDANSETRON HCL 4 MG PO TABS: 4.0000 mg | ORAL_TABLET | Freq: Three times a day (TID) | ORAL | 0 refills | Status: AC | PRN

## 2023-06-15 NOTE — Progress Notes (Signed)
        Established patient visit  History, exam, impression, and plan:  1. Type 2 diabetes mellitus with hyperglycemia, without long-term current use of insulin [facility]) Pleasant 37 year old female presenting today with reports of significant hyperglycemia.  She has not been checking her blood sugars regularly but when she had polydipsia and polyuria yesterday, she began to be worried.  She woke this morning feeling horrible and decided to check her sugar.  She got a result of 339 and was quite anxious about this.  She was previously prescribed Ozempic however never received information regarding coverage or availability at the pharmacy.  She is on prednisone per the renal transplant team at 5 mg daily.  POCT glucose today at 366.  Discussed recommendations for a low carbohydrate diet that is very limited in concentrated sweets.  Over the next couple of days, would like her to be very mindful of her diet.  Recommend checking sugars to make sure they are coming down.  Provided with sample pens of Ozempic with instructions of 0.25 mg weekly x 4 weeks then increase to 0.5 mg weekly.  I have resent this to the pharmacy and notified our prior authorization staff to expedite.  Plan to return in 1 month to recheck hemoglobin A1c and evaluate tolerance of the medication.  Sending in Zofran as needed for nausea. - POCT Glucose (CBG)  Procedures performed this visit: None.  Return in about 4 weeks (around 07/13/2023) for DM follow up.  __________________________________ Thayer Ohm, DNP, APRN, FNP-BC Primary Care and Sports Medicine [facility]

## 2023-06-15 NOTE — Telephone Encounter (Signed)
Initiated Prior authorization [id number] (0.25 or 0.5 MG/DOSE) 2MG /3ML pen-injectors  Via: Covermymeds Case/Key:[id number] Status: approved as of 06/15/23 Reason:Authorization Expiration Date: September 04, 2023. Notified Pt via: Mychart

## 2023-06-16 ENCOUNTER — Encounter: Payer: Self-pay | Admitting: Medical-Surgical

## 2023-06-16 DIAGNOSIS — I10 Essential (primary) hypertension: Secondary | ICD-10-CM | POA: Diagnosis not present

## 2023-06-16 DIAGNOSIS — Z7982 Long term (current) use of aspirin: Secondary | ICD-10-CM | POA: Diagnosis not present

## 2023-06-16 DIAGNOSIS — Z79899 Other long term (current) drug therapy: Secondary | ICD-10-CM | POA: Diagnosis not present

## 2023-06-16 DIAGNOSIS — E1165 Type 2 diabetes mellitus with hyperglycemia: Secondary | ICD-10-CM | POA: Diagnosis not present

## 2023-06-16 DIAGNOSIS — Z794 Long term (current) use of insulin: Secondary | ICD-10-CM | POA: Diagnosis not present

## 2023-06-18 ENCOUNTER — Ambulatory Visit (INDEPENDENT_AMBULATORY_CARE_PROVIDER_SITE_OTHER): Payer: 59 | Admitting: Medical-Surgical

## 2023-06-18 ENCOUNTER — Encounter: Payer: Self-pay | Admitting: Medical-Surgical

## 2023-06-18 VITALS — BP 126/80 | HR 84 | Resp 20 | Ht 62.0 in | Wt 229.7 lb

## 2023-06-18 DIAGNOSIS — E1165 Type 2 diabetes mellitus with hyperglycemia: Secondary | ICD-10-CM | POA: Diagnosis not present

## 2023-06-18 DIAGNOSIS — Z23 Encounter for immunization: Secondary | ICD-10-CM | POA: Diagnosis not present

## 2023-06-18 LAB — GLUCOSE, POCT (MANUAL RESULT ENTRY): POC Glucose: 408 mg/dL — AB (ref 70–99)

## 2023-06-18 MED ORDER — INSULIN LISPRO (1 UNIT DIAL) 100 UNIT/ML (KWIKPEN): PEN_INJECTOR | SUBCUTANEOUS | 11 refills | Status: DC

## 2023-06-18 MED ORDER — TOUJEO MAX SOLOSTAR 300 UNIT/ML ~~LOC~~ SOPN: 10.0000 [IU] | PEN_INJECTOR | Freq: Every day | SUBCUTANEOUS | 3 refills | Status: DC

## 2023-06-18 MED ORDER — INSULIN PEN NEEDLE 31G X 5 MM MISC: 11 refills | Status: DC

## 2023-06-18 MED ORDER — FREESTYLE LIBRE 3 SENSOR MISC: 11 refills | Status: DC

## 2023-06-18 NOTE — Progress Notes (Unsigned)
        Established patient visit  History, exam, impression, and plan:  1. Type 2 diabetes mellitus with hyperglycemia, without long-term current use of insulin (HCC) Pleasant 37 year old female returns for follow-up on hyperglycemia.  She was seen approximately 3 days ago with reports of hyperglycemia.  At that time, we realized that she has never gotten approval through her insurance to start Ozempic.  She was provided with a sample pen and the prior authorization was expedited.  She started the Ozempic 0.25 mg weekly the same day however her glucose continued to climb.  She was evaluated at the emergency room over the weekend where they administered 5 units of fast acting insulin then discharged her home with no medication changes.  Today she reports that her sugars are still extremely high in the 300s and 400s.  During our appointment, a point-of-care glucose showed a reading of 406.  Discussed recommendations for management.  Starting Toujeo 10 units daily.  Adding Humalog fast acting insulin on a sliding scale with conservative treatment for now.  Continue Ozempic as previously discussed with 4 weeks at 0.25 mg weekly then increase to 0.5 mg weekly.  Monitor sugars closely at home and if these numbers start to drop, we can certainly dial back on the Humalog and Toujeo as needed.  Samples of freestyle libre 3 provided in office today to aid with continuous glucose monitoring.  Prescriptions sent to pharmacy to evaluate insurance coverage. - POCT Glucose (CBG)  2. Need for influenza vaccination Flu vaccine given in office today. - Flu vaccine trivalent PF, 6mos and older(Flulaval,Afluria,Fluarix,Fluzone)   Procedures performed this visit: None.  Return in about 2 weeks (around 07/02/2023) for DM follow up.  __________________________________ Thayer Ohm, DNP, APRN, FNP-BC Primary Care and Sports Medicine [facility] [place]

## 2023-06-18 NOTE — Telephone Encounter (Signed)
Contacted patient and scheduled her to see Christen Butter at 2:20 this afternoon.

## 2023-06-19 ENCOUNTER — Other Ambulatory Visit: Payer: Self-pay | Admitting: Medical-Surgical

## 2023-06-20 ENCOUNTER — Telehealth: Payer: Self-pay

## 2023-06-20 ENCOUNTER — Encounter: Payer: Self-pay | Admitting: Medical-Surgical

## 2023-06-20 NOTE — Telephone Encounter (Signed)
Called patient to check on her blood sugars.  Yesterday before food it was 420 something  Today around 11:15 it was 280  She didn't have any questions or concerns at this time.

## 2023-06-21 DIAGNOSIS — Z5181 Encounter for therapeutic drug level monitoring: Secondary | ICD-10-CM | POA: Diagnosis not present

## 2023-06-21 DIAGNOSIS — Z79621 Long term (current) use of calcineurin inhibitor: Secondary | ICD-10-CM | POA: Diagnosis not present

## 2023-06-21 DIAGNOSIS — T80810A Extravasation of vesicant antineoplastic chemotherapy, initial encounter: Secondary | ICD-10-CM | POA: Diagnosis not present

## 2023-06-21 DIAGNOSIS — Z794 Long term (current) use of insulin: Secondary | ICD-10-CM | POA: Diagnosis not present

## 2023-06-21 DIAGNOSIS — I10 Essential (primary) hypertension: Secondary | ICD-10-CM | POA: Diagnosis not present

## 2023-06-21 DIAGNOSIS — Z940 Kidney transplant status: Secondary | ICD-10-CM | POA: Diagnosis not present

## 2023-06-21 DIAGNOSIS — D509 Iron deficiency anemia, unspecified: Secondary | ICD-10-CM | POA: Diagnosis not present

## 2023-06-21 DIAGNOSIS — N02B9 Other recurrent and persistent immunoglobulin A nephropathy: Secondary | ICD-10-CM | POA: Diagnosis not present

## 2023-06-21 DIAGNOSIS — Z79899 Other long term (current) drug therapy: Secondary | ICD-10-CM | POA: Diagnosis not present

## 2023-06-21 DIAGNOSIS — E1165 Type 2 diabetes mellitus with hyperglycemia: Secondary | ICD-10-CM | POA: Diagnosis not present

## 2023-06-21 DIAGNOSIS — D849 Immunodeficiency, unspecified: Secondary | ICD-10-CM | POA: Diagnosis not present

## 2023-06-21 MED ORDER — FREESTYLE LIBRE 3 PLUS SENSOR MISC: 11 refills | Status: DC

## 2023-07-02 ENCOUNTER — Ambulatory Visit: Payer: 59 | Admitting: Medical-Surgical

## 2023-07-02 NOTE — Progress Notes (Deleted)
        Established patient visit  History, exam, impression, and plan:  Primary female hypogonadism Up-to-date testosterone labs.  Due for his next testosterone injection.  Doing well on his current regimen with stable vitals.  Testosterone cypionate 100 mg IM given x 1 in the office.  Due for next shot in 2 weeks.  Lump of skin of lower extremity, left Noted a lump on the left posterior calf approximately 1 month ago.  Initially, had redness, tenderness, swelling at the area however this has fully resolved.  No longer has any residual symptoms but the lump has remained.  His wife noticed it and urged him to be seen to evaluate it.  History notable for varicose veins, compliant with recommendations for compression socks daily.  No recent injury or trauma to the area.  Unclear etiology.  The lump is approximately 1 cm x 1.5 cm and slightly discolored.  See clinical photo.  Plan to get vascular ultrasound as it does seem to be connected to one of the varicose veins that runs through the area.  Suspect superficial thrombophlebitis initially.  Discussed conservative measures with Tylenol, heat, ice, and compression socks.    Procedures performed this visit: None.  Return in about 2 weeks (around 12/13/2022) for nurse visit for testosterone shot.  __________________________________ Joy L. Jessup, DNP, APRN, FNP-BC Primary Care and Sports Medicine [facility] [place]  

## 2023-07-03 ENCOUNTER — Ambulatory Visit: Payer: 59 | Attending: Medical-Surgical

## 2023-07-03 ENCOUNTER — Ambulatory Visit (INDEPENDENT_AMBULATORY_CARE_PROVIDER_SITE_OTHER): Payer: 59 | Admitting: Medical-Surgical

## 2023-07-03 ENCOUNTER — Encounter: Payer: Self-pay | Admitting: Medical-Surgical

## 2023-07-03 VITALS — BP 120/83 | HR 71 | Resp 20 | Ht 62.0 in | Wt 230.3 lb

## 2023-07-03 DIAGNOSIS — Z87440 Personal history of urinary (tract) infections: Secondary | ICD-10-CM | POA: Diagnosis not present

## 2023-07-03 DIAGNOSIS — I493 Ventricular premature depolarization: Secondary | ICD-10-CM

## 2023-07-03 DIAGNOSIS — E1165 Type 2 diabetes mellitus with hyperglycemia: Secondary | ICD-10-CM

## 2023-07-03 HISTORY — DX: Ventricular premature depolarization: I49.3

## 2023-07-03 LAB — POCT URINALYSIS DIP (CLINITEK)
Bilirubin, UA: NEGATIVE
Glucose, UA: NEGATIVE mg/dL
Ketones, POC UA: NEGATIVE mg/dL
Nitrite, UA: NEGATIVE
POC PROTEIN,UA: 30 — AB
Spec Grav, UA: 1.02 (ref 1.010–1.025)
Urobilinogen, UA: 0.2 U/dL
pH, UA: 6 (ref 5.0–8.0)

## 2023-07-03 LAB — POCT UA - MICROALBUMIN
Creatinine, POC: 300 mg/dL
Microalbumin Ur, POC: 150 mg/L

## 2023-07-03 LAB — POCT GLYCOSYLATED HEMOGLOBIN (HGB A1C)
HbA1c, POC (controlled diabetic range): 9.7 % — AB (ref 0.0–7.0)
Hemoglobin A1C: 9.7 % — AB (ref 4.0–5.6)

## 2023-07-03 NOTE — Progress Notes (Unsigned)
Enrolled for [employer] to mail a ZIO XT long term holter monitor to the patients address on file.   EP to read.

## 2023-07-03 NOTE — Progress Notes (Signed)
        Established patient visit  History, exam, impression, and plan:  1. Type 2 diabetes mellitus with hyperglycemia, without long-term current use of insulin [facility]) Pleasant 37 year old female presenting today for follow-up on type 2 diabetes.  She recently had a period of uncontrolled blood sugars with her highest reading at 525.  She is on her third week of Ozempic 0.25 mg, tolerating well without side effects.  Using 10 units of Toujeo nightly.  Also has Humalog KwikPen to use for sliding scale but notes that her overall sugars are doing much better.  She is cognizant of carbohydrate content and looks nutrition labels regularly.  Is limiting her carbs per meal and snack as instructed.  Fasting sugars over the last several days have been in the 100s.  Diabetic foot exam completed.  Hemoglobin A1c recheck today, 6.9% 3 months ago but up to 9.7% now.  This is not surprising given the recent climb and blood sugars.  Microalbumin abnormal today.  Continue with Toujeo and fast acting sliding scale as needed.  Increase Ozempic to 0.5 mg after 1 more week of the 0.25 mg dose.  Plan to continue Ozempic 0.5 mg weekly for 4 weeks and if doing well, advised her to message me and we will go up to the 1 mg dose.  Patient verbalized understanding is agreeable to the plan. - POCT HgB A1C - HM Diabetes Foot Exam - POCT UA - Microalbumin  2. Frequent PVCs History of PVCs that were infrequent and not associated with any other symptoms.  Today notes that the palpitations have increased in frequency and occur multiple times each hour while awake.  Discussed that this may be a worsening of her cardiac issue versus anxiety about her health.  Plan to repeat a long-term monitor for further evaluation. - LONG TERM MONITOR (3-14 DAYS); Future  3. History of UTI Recently found presence of UTI that was treated with 5-day course of antibiotics.  This was suspected to be the cause of her abrupt spike in glucose.  Recheck of  urinalysis today shows small amount of protein with trace intact blood and trace leukocytes.  Sending for culture. - POCT URINALYSIS DIP (CLINITEK) - Urine Culture   Procedures performed this visit: None.  Return in about 3 months (around 10/03/2023) for DM/HTN/HLD follow up.  __________________________________ Thayer Ohm, DNP, APRN, FNP-BC Primary Care and Sports Medicine [facility] [place]

## 2023-07-05 DIAGNOSIS — I493 Ventricular premature depolarization: Secondary | ICD-10-CM

## 2023-07-05 LAB — URINE CULTURE

## 2023-07-13 ENCOUNTER — Ambulatory Visit: Payer: 59 | Admitting: Medical-Surgical

## 2023-07-18 ENCOUNTER — Encounter: Payer: 59 | Attending: Medical-Surgical | Admitting: Dietician

## 2023-07-18 DIAGNOSIS — I493 Ventricular premature depolarization: Secondary | ICD-10-CM | POA: Diagnosis not present

## 2023-07-18 DIAGNOSIS — E1165 Type 2 diabetes mellitus with hyperglycemia: Secondary | ICD-10-CM | POA: Diagnosis not present

## 2023-07-18 DIAGNOSIS — Z713 Dietary counseling and surveillance: Secondary | ICD-10-CM | POA: Insufficient documentation

## 2023-07-18 NOTE — Progress Notes (Signed)
Diabetes Self-Management Education  Visit Type: First/Initial  Appt. Start Time: 1500 Appt. End Time: 1510  07/18/2023  [name], identified by name and date of birth, is a 37 y.o. female with a diagnosis of Diabetes: Type 2.   ASSESSMENT   Patient is here today alone. Patient would like to learn more about kidney health, protein intake, diabetes, sugar alcohols and artifical sweeteners. Patient lives with fiance and three boys. Pt sates she is doing the shopping and cooking. Pt reports she is in school at this time. Pt reports her appetite with GLP is decreased and she feels nauseas at times. Pt reports she monitoring Blood pressure at home. Pt reports she has been trying to intermittently fast. Pt was abel to accurately determine the amount of carbohydrate using a nutrition labels during today's encounter.    History includes:   Past Medical History:  Diagnosis Date   Cervical dysplasia    Hx of migraines     Labs noted:   Lab Results  Component Value Date   HGBA1C 9.7 (A) 07/03/2023   HGBA1C 9.7 (A) 07/03/2023    Lab Results  Component Value Date   NA 140 04/29/2021   CL 107 04/29/2021   K 4.9 04/29/2021   CO2 22 04/29/2021   BUN 19 04/29/2021   CREATININE 2.64 (H) 04/29/2021   EGFR 23 (L) 04/29/2021   CALCIUM 9.1 04/29/2021   PHOS 2.9 04/29/2021   ALBUMIN 1.3 (L) 07/30/2020   GLUCOSE 72 04/29/2021   Medications include:   Current Outpatient Medications:    amLODipine (NORVASC) 10 MG tablet, Take 1 tablet (10 mg total) by mouth daily., Disp: 90 tablet, Rfl: 1   ascorbic acid (VITAMIN C) 250 MG tablet, Take by mouth., Disp: , Rfl:    atorvastatin (LIPITOR) 10 MG tablet, Take 1 tablet by mouth at bedtime., Disp: , Rfl:    carvedilol (COREG) 3.125 MG tablet, Take 1 tablet (3.125 mg total) by mouth 2 (two) times daily with a meal., Disp: 180 tablet, Rfl: 1   Continuous Glucose Sensor (FREESTYLE LIBRE 3 PLUS SENSOR) MISC, Change sensor every 15 days., Disp: 2  each, Rfl: 11   Continuous Glucose Sensor (FREESTYLE LIBRE 3 SENSOR) MISC, Use to monitor glucose continuously., Disp: 2 each, Rfl: 11   insulin glargine, 2 Unit Dial, (TOUJEO MAX SOLOSTAR) 300 UNIT/ML Solostar Pen, Inject 10 Units into the skin daily., Disp: 3 mL, Rfl: 3   insulin lispro (HUMALOG KWIKPEN) 100 UNIT/ML KwikPen, Novolog sliding scale three times daily with meals under 200- none, 201-250 give 2 units, 251-300 give 3 units, 301-350 give 4 units, 351-400 give 5 units.  Please call or message if BG is consistently less than 70 or greater than 300 Monitor BG level before meals and before bed Monitor carb intake, no more than 45-60 g of carb per meal and no more than 15 g of carb per snack., Disp: 15 mL, Rfl: 11   Insulin Pen Needle 31G X 5 MM MISC, Use to inject Toujeo and Humalog insulin up to 5 times daily., Disp: 100 each, Rfl: 11   Magnesium Glycinate 120 MG CAPS, Take 200 mg by mouth 2 (two) times daily., Disp: , Rfl:    mycophenolate (MYFORTIC) 180 MG EC tablet, Take 180 mg by mouth 2 (two) times daily., Disp: , Rfl:    ondansetron (ZOFRAN) 4 MG tablet, Take 1 tablet (4 mg total) by mouth every 8 (eight) hours as needed for nausea or vomiting., Disp: 20 tablet,  Rfl: 0   predniSONE (DELTASONE) 5 MG tablet, Take 1 tablet by mouth daily., Disp: , Rfl:    Semaglutide,0.25 or 0.5MG /DOS, (OZEMPIC, 0.25 OR 0.5 MG/DOSE,) 2 MG/3ML SOPN, Inject 0.25 mg weekly into the skin for 4 weeks then increase to 0.5 mg weekly., Disp: 9 mL, Rfl: 1   Sulfamethoxazole-Trimethoprim (BACTRIM PO), Take by mouth., Disp: , Rfl:    tacrolimus ER (ENVARSUS XR) 1 MG TB24, Take 1 mg by mouth daily before breakfast., Disp: , Rfl:    tacrolimus ER (ENVARSUS XR) 4 MG TB24, Take 4 mg by mouth daily before breakfast., Disp: , Rfl:    Vitamin D, Ergocalciferol, (DRISDOL) 1.25 MG (50000 UNIT) CAPS capsule, Take 1 capsule by mouth once a week., Disp: , Rfl:    etonogestrel (NEXPLANON) 68 MG IMPL implant, 68 mg by Subdermal  route once., Disp: , Rfl:      CGM Results from download: Libre 3  % Time CGM active:   95 %   (Goal >70%)  Average glucose:   175 mg/dL for 7 days  Glucose management indicator:   7.5 %  Time in range (70-180 mg/dL):   58 %   (Goal >96%)  Time High (181-250 mg/dL):   41 %   (Goal < 29%)  Time Very High (>250 mg/dL):    1 %   (Goal < 5%)  Time Low (54-69 mg/dL):   0 %   (Goal <5%)  Time Very Low (<54 mg/dL):   0 %   (Goal <2%)    There were no vitals taken for this visit. There is no height or weight on file to calculate BMI.   Diabetes Self-Management Education - 07/18/23 1401       Visit Information   Visit Type First/Initial      Initial Visit   Diabetes Type Type 2    Date Diagnosed 2023    Are you currently following a meal plan? No    Are you taking your medications as prescribed? Yes      Health Coping   How would you rate your overall health? Fair      Psychosocial Assessment   Patient Belief/Attitude about Diabetes Motivated to manage diabetes    What is the hardest part about your diabetes right now, causing you the most concern, or is the most worrisome to you about your diabetes?   Making healty food and beverage choices    Self-care barriers None    Self-management support Doctor's office    Other persons present Patient    Patient Concerns Nutrition/Meal planning    Special Needs None    Preferred Learning Style No preference indicated    Learning Readiness Ready    How often do you need to have someone help you when you read instructions, pamphlets, or other written materials from your doctor or pharmacy? 1 - Never    What is the last grade level you completed in school? college      Pre-Education Assessment   Patient understands the diabetes disease and treatment process. Needs Instruction    Patient understands incorporating nutritional management into lifestyle. Needs Instruction    Patient undertands incorporating physical activity into lifestyle.  Needs Instruction    Patient understands using medications safely. Needs Instruction    Patient understands monitoring blood glucose, interpreting and using results Needs Instruction    Patient understands prevention, detection, and treatment of acute complications. Needs Instruction    Patient understands prevention, detection, and treatment of  chronic complications. Needs Instruction    Patient understands how to develop strategies to address psychosocial issues. Needs Instruction    Patient understands how to develop strategies to promote health/change behavior. Needs Instruction      Complications   Last HgB A1C per patient/outside source 9.7 %    How often do you check your blood sugar? > 4 times/day    Fasting Blood glucose range (mg/dL) 16-109;604-540    Postprandial Blood glucose range (mg/dL) 981-191;478-295    Number of hypoglycemic episodes per month 0    Number of hyperglycemic episodes ( >200mg /dL): Never    Can you tell when your blood sugar is high? No    Have you had a dilated eye exam in the past 12 months? Yes    Have you had a dental exam in the past 12 months? Yes    Are you checking your feet? Yes    How many days per week are you checking your feet? 1      Dietary Intake   Breakfast Skips    Lunch ~12pm: 1 packet of flavored instant oatmeal or  1-2 small thin, pancakes, [place] bacon, SF sryup or bowl of fruit    Snack (afternoon) ~3-4pm: apples, berries,    Dinner ~8pm: fried or grilled chicken, tomato basil  tortilla, lettuce, mayo, water sometime cheese    Snack (evening) yogurt oat bar, water    Beverage(s) water, zero sugar tea, spakling Ice      Activity / Exercise   How many days per week do you exercise? 3    How many minutes per day do you exercise? 45    Total minutes per week of exercise 135      Patient Education   Previous Diabetes Education Yes (please comment)    Disease Pathophysiology Definition of diabetes, type 1 and 2, and the diagnosis of  diabetes    Healthy Eating Food label reading, portion sizes and measuring food.;Plate Method;Carbohydrate counting;Reviewed blood glucose goals for pre and post meals and how to evaluate the patients' food intake on their blood glucose level.;Meal timing in regards to the patients' current diabetes medication.;Role of diet in the treatment of diabetes and the relationship between the three main macronutrients and blood glucose level    Being Active Role of exercise on diabetes management, blood pressure control and cardiac health.    Medications Reviewed patients medication for diabetes, action, purpose, timing of dose and side effects.    Monitoring Taught/evaluated CGM (comment);Identified appropriate SMBG and/or A1C goals.;Daily foot exams    Acute complications Taught prevention, symptoms, and  treatment of hypoglycemia - the 15 rule.    Chronic complications Relationship between chronic complications and blood glucose control    Diabetes Stress and Support Role of stress on diabetes    Lifestyle and Health Coping Helped patient develop diabetes management plan for (enter comment)      Individualized Goals (developed by patient)   Nutrition Follow meal plan discussed    Physical Activity Exercise 3-5 times per week;30 minutes per day    Medications take my medication as prescribed    Monitoring  Consistenly use CGM    Problem Solving Eating Pattern    Reducing Risk do foot checks daily;treat hypoglycemia with 15 grams of carbs if blood glucose less than 70mg /dL    Health Coping Ask for help with psychological, social, or emotional issues      Post-Education Assessment   Patient understands the diabetes disease and treatment process.  Needs Review    Patient understands incorporating nutritional management into lifestyle. Needs Review    Patient undertands incorporating physical activity into lifestyle. Needs Review    Patient understands using medications safely. Needs Review    Patient  understands monitoring blood glucose, interpreting and using results Needs Review    Patient understands prevention, detection, and treatment of acute complications. Needs Review    Patient understands prevention, detection, and treatment of chronic complications. Needs Review    Patient understands how to develop strategies to address psychosocial issues. Needs Review    Patient understands how to develop strategies to promote health/change behavior. Needs Review      Outcomes   Expected Outcomes Demonstrated interest in learning. Expect positive outcomes             Individualized Plan for Diabetes Self-Management Training:   Learning Objective:  Patient will have a greater understanding of diabetes self-management. Patient education plan is to attend individual and/or group sessions per assessed needs and concerns.   Plan:   Patient Instructions  1 Aim for balanced meals on a schedule  Carb count or plate planner  Expected Outcomes:  Demonstrated interest in learning. Expect positive outcomes  Education material provided: ADA - How to Thrive: A Guide for Your Journey with Diabetes, My Plate, Snack sheet, and Carbohydrate counting sheet  If problems or questions, patient to contact team via:  Phone  Future DSME appointment:

## 2023-07-18 NOTE — Patient Instructions (Signed)
1 Aim for balanced meals on a schedule  Carb count or plate planner

## 2023-07-19 DIAGNOSIS — E1165 Type 2 diabetes mellitus with hyperglycemia: Secondary | ICD-10-CM | POA: Diagnosis not present

## 2023-07-19 DIAGNOSIS — Z4822 Encounter for aftercare following kidney transplant: Secondary | ICD-10-CM | POA: Diagnosis not present

## 2023-07-19 DIAGNOSIS — Z940 Kidney transplant status: Secondary | ICD-10-CM | POA: Diagnosis not present

## 2023-07-19 DIAGNOSIS — I10 Essential (primary) hypertension: Secondary | ICD-10-CM | POA: Diagnosis not present

## 2023-07-19 DIAGNOSIS — Z79621 Long term (current) use of calcineurin inhibitor: Secondary | ICD-10-CM | POA: Diagnosis not present

## 2023-07-19 DIAGNOSIS — D849 Immunodeficiency, unspecified: Secondary | ICD-10-CM | POA: Diagnosis not present

## 2023-07-19 DIAGNOSIS — Z5181 Encounter for therapeutic drug level monitoring: Secondary | ICD-10-CM | POA: Diagnosis not present

## 2023-08-20 DIAGNOSIS — Z79899 Other long term (current) drug therapy: Secondary | ICD-10-CM | POA: Diagnosis not present

## 2023-08-20 DIAGNOSIS — E139 Other specified diabetes mellitus without complications: Secondary | ICD-10-CM | POA: Diagnosis not present

## 2023-08-20 DIAGNOSIS — D849 Immunodeficiency, unspecified: Secondary | ICD-10-CM | POA: Diagnosis not present

## 2023-08-20 DIAGNOSIS — Z79621 Long term (current) use of calcineurin inhibitor: Secondary | ICD-10-CM | POA: Diagnosis not present

## 2023-08-20 DIAGNOSIS — Z4822 Encounter for aftercare following kidney transplant: Secondary | ICD-10-CM | POA: Diagnosis not present

## 2023-08-20 DIAGNOSIS — I10 Essential (primary) hypertension: Secondary | ICD-10-CM | POA: Diagnosis not present

## 2023-08-20 DIAGNOSIS — D509 Iron deficiency anemia, unspecified: Secondary | ICD-10-CM | POA: Diagnosis not present

## 2023-08-20 DIAGNOSIS — Z940 Kidney transplant status: Secondary | ICD-10-CM | POA: Diagnosis not present

## 2023-08-20 DIAGNOSIS — Z5181 Encounter for therapeutic drug level monitoring: Secondary | ICD-10-CM | POA: Diagnosis not present

## 2023-09-17 DIAGNOSIS — D849 Immunodeficiency, unspecified: Secondary | ICD-10-CM | POA: Diagnosis not present

## 2023-09-17 DIAGNOSIS — Z79899 Other long term (current) drug therapy: Secondary | ICD-10-CM | POA: Diagnosis not present

## 2023-09-17 DIAGNOSIS — Z4822 Encounter for aftercare following kidney transplant: Secondary | ICD-10-CM | POA: Diagnosis not present

## 2023-09-17 DIAGNOSIS — E1165 Type 2 diabetes mellitus with hyperglycemia: Secondary | ICD-10-CM | POA: Diagnosis not present

## 2023-09-17 DIAGNOSIS — Z940 Kidney transplant status: Secondary | ICD-10-CM | POA: Diagnosis not present

## 2023-09-17 DIAGNOSIS — I10 Essential (primary) hypertension: Secondary | ICD-10-CM | POA: Diagnosis not present

## 2023-09-17 DIAGNOSIS — D84821 Immunodeficiency due to drugs: Secondary | ICD-10-CM | POA: Diagnosis not present

## 2023-09-17 DIAGNOSIS — Z79621 Long term (current) use of calcineurin inhibitor: Secondary | ICD-10-CM | POA: Diagnosis not present

## 2023-09-17 DIAGNOSIS — D509 Iron deficiency anemia, unspecified: Secondary | ICD-10-CM | POA: Diagnosis not present

## 2023-09-17 DIAGNOSIS — Z949 Transplanted organ and tissue status, unspecified: Secondary | ICD-10-CM | POA: Diagnosis not present

## 2023-09-17 DIAGNOSIS — Z7969 Long term (current) use of other immunomodulators and immunosuppressants: Secondary | ICD-10-CM | POA: Diagnosis not present

## 2023-09-17 DIAGNOSIS — Z5181 Encounter for therapeutic drug level monitoring: Secondary | ICD-10-CM | POA: Diagnosis not present

## 2023-09-17 LAB — COMPREHENSIVE METABOLIC PANEL: eGFR: 44

## 2023-09-21 ENCOUNTER — Telehealth: Payer: Self-pay

## 2023-09-21 NOTE — Progress Notes (Signed)
Care Guide Pharmacy Note  09/21/2023 Name: Lindsay Hall MRN: [id number] DOB: 1986/04/10  Referred By: Christen Butter, NP Reason for referral: Care Coordination (TNM Diabetes. )   Lindsay Hall is a 38 y.o. year old female who is a primary care patient of Christen Butter, NP.  Lindsay Hall was referred to the pharmacist for assistance related to: DMII  An unsuccessful telephone outreach was attempted today to contact the patient who was referred to the pharmacy team for assistance with diabetes. Additional attempts will be made to contact the patient.  [facility]  [facility], [facility] Health Care Management Assistant Direct Dial: [phone]  Fax: [phone]

## 2023-09-21 NOTE — Progress Notes (Signed)
Care Guide Pharmacy Note  09/21/2023 Name: Lakindra Manikowski MRN: [id number] DOB: Dec 12, 1985  Referred By: Christen Butter, NP Reason for referral: Care Coordination (TNM Diabetes. )   Rilla Edman is a 38 y.o. year old female who is a primary care patient of Christen Butter, NP.  Laziyah Kwiecinski was referred to the pharmacist for assistance related to: DMII  Successful contact was made with the patient to discuss pharmacy services including being ready for the pharmacist to call at least 5 minutes before the scheduled appointment time and to have medication bottles and any blood pressure readings ready for review. The patient agreed to meet with the pharmacist via telephone visit on (date/time). 09/26/23 at 8:30 a.m.   [facility]  [facility], [facility] Health Care Management Assistant Direct Dial: [phone]  Fax: [phone]

## 2023-09-24 ENCOUNTER — Ambulatory Visit: Payer: 59 | Admitting: Dietician

## 2023-09-26 ENCOUNTER — Other Ambulatory Visit: Payer: Self-pay

## 2023-09-26 NOTE — Progress Notes (Signed)
09/26/2023 Name: Lindsay Hall MRN: [id number] DOB: 15-Jun-1986  Chief Complaint  Patient presents with   Diabetes   Lindsay Hall is a 38 y.o. year old female who presented for a telephone visit.   They were referred to the pharmacist by a quality report for assistance in managing diabetes.   Subjective:  Care Team: Primary Care Provider: Christen Butter, NP ; Next Scheduled Visit: 10/03/2023  Medication Access/Adherence  Current Pharmacy:  [id number] - [place], [place] - [address] [place] [address] Phone: [phone] Fax: [phone]  -Patient reports affordability concerns with their medications: No  -Patient reports access/transportation concerns to their pharmacy: No  -Patient reports adherence concerns with their medications:  No    Diabetes: -Patient identified as meeting criteria for TNM regarding health equity gap between Caucasian and African-American patients with A1c < 8% -Current medications: Ozempic 0.5 mg weekly, Humalog KwikPen per sliding scale, Toujeo max 10 units daily -Patient states she has not needed to use mealtime Humalog since increasing Ozempic to 0.5 mg weekly -Patient is using freestyle libre3 plus for continuous glucose monitoring -Patient endorses FBG 70-80; 2-hour postprandial BG 150 -Patient denies hypoglycemic s/sx including dizziness, shakiness, sweating.  -Patient denies hyperglycemic symptoms including polyuria, polydipsia, polyphagia, nocturia, neuropathy, blurred vision. -Patient has been on Ozempic for a few months now and endorses that she tolerates this medication well with no adverse side effects -Endorses good adherence to medication regimen and no barriers in regard to access or affordability  Objective: Lab Results  Component Value Date   HGBA1C 9.7 (A) 07/03/2023   HGBA1C 9.7 (A) 07/03/2023   Lab Results  Component Value Date   CREATININE 2.64 (H) 04/29/2021    BUN 19 04/29/2021   NA 140 04/29/2021   K 4.9 04/29/2021   CL 107 04/29/2021   CO2 22 04/29/2021   Medications Reviewed Today     Reviewed by Lenna Gilford, RPH (Pharmacist) on 09/26/23 at 0847  Med List Status: <None>   Medication Order Taking? Sig Documenting Provider Last Dose Status Informant  amLODipine (NORVASC) 10 MG tablet [id number] Yes Take 1 tablet (10 mg total) by mouth daily. Christen Butter, NP Taking Active   atorvastatin (LIPITOR) 10 MG tablet [id number] Yes Take 1 tablet by mouth at bedtime. [provider] Taking Active   carvedilol (COREG) 3.125 MG tablet [id number] Yes Take 1 tablet (3.125 mg total) by mouth 2 (two) times daily with a meal. Christen Butter, NP Taking Active   Continuous Glucose Sensor (FREESTYLE LIBRE 3 PLUS SENSOR) MISC [id number] Yes Change sensor every 15 days. Christen Butter, NP Taking Active   Continuous Glucose Sensor (FREESTYLE LIBRE 3 SENSOR) [place] [id number] Yes Use to monitor glucose continuously. Christen Butter, NP Taking Active   etonogestrel (NEXPLANON) 68 MG IMPL implant [id number] Yes 68 mg by Subdermal route once. [provider] Taking Active   insulin glargine, 2 Unit Dial, (TOUJEO MAX SOLOSTAR) 300 UNIT/ML Solostar Pen [id number] Yes Inject 10 Units into the skin daily. Christen Butter, NP Taking Active   insulin lispro (HUMALOG KWIKPEN) 100 UNIT/ML KwikPen [id number] No Novolog sliding scale three times daily with meals under 200- none, 201-250 give 2 units, 251-300 give 3 units, 301-350 give 4 units, 351-400 give 5 units.  Please call or message if BG is consistently less than 70 or greater than 300 Monitor BG level before meals and before bed Monitor carb intake, no more than 45-60 g of carb per meal and  no more than 15 g of carb per snack.  Patient not taking: Reported on 09/26/2023   Christen Butter, NP Not Taking Active   Insulin Pen Needle 31G X 5 MM MISC [id number] Yes Use to inject Toujeo and Humalog insulin up to 5 times daily. Christen Butter, NP Taking Active   Magnesium Glycinate 120 MG CAPS [id number] Yes Take 200 mg by mouth 2 (two) times daily. [provider] Taking Active   mycophenolate (MYFORTIC) 180 MG EC tablet [id number] Yes Take 180 mg by mouth 2 (two) times daily. [provider] Taking Active   ondansetron (ZOFRAN) 4 MG tablet [id number] Yes Take 1 tablet (4 mg total) by mouth every 8 (eight) hours as needed for nausea or vomiting. Christen Butter, NP Taking Active   predniSONE (DELTASONE) 5 MG tablet [id number] Yes Take 1 tablet by mouth daily. [provider] Taking Active   Semaglutide,0.25 or 0.5MG /DOS, (OZEMPIC, 0.25 OR 0.5 MG/DOSE,) 2 MG/3ML SOPN [id number] Yes Inject 0.25 mg weekly into the skin for 4 weeks then increase to 0.5 mg weekly. Christen Butter, NP Taking Active   tacrolimus ER (ENVARSUS XR) 1 MG TB24 [id number] Yes Take 1 mg by mouth daily before breakfast. [provider] Taking Active   tacrolimus ER (ENVARSUS XR) 4 MG TB24 [id number] Yes Take 4 mg by mouth daily before breakfast. [provider] Taking Active   Vitamin D, Ergocalciferol, (DRISDOL) 1.25 MG (50000 UNIT) CAPS capsule [id number] Yes Take 1 capsule by mouth once a week. [provider] Taking Active            Assessment/Plan:   Diabetes: -Reviewed goal A1c, goal fasting, and goal 2 hour post prandial glucose -Patient sees PCP again 129, and I recommend follow-up A1c at that time -Recommend increasing Ozempic to 1 mg weekly and decreasing or stopping Toujeo based on current A1c-patient is in agreement with this plan if primary care provider is -I have sent patient and invite to share her [facility] records with [place] via the [employer] view dashboard  Follow Up Plan: 3 months  Lenna Gilford, PharmD, DPLA

## 2023-09-28 DIAGNOSIS — D849 Immunodeficiency, unspecified: Secondary | ICD-10-CM | POA: Diagnosis not present

## 2023-09-28 DIAGNOSIS — L821 Other seborrheic keratosis: Secondary | ICD-10-CM | POA: Diagnosis not present

## 2023-09-28 DIAGNOSIS — D229 Melanocytic nevi, unspecified: Secondary | ICD-10-CM | POA: Diagnosis not present

## 2023-09-28 DIAGNOSIS — Z940 Kidney transplant status: Secondary | ICD-10-CM | POA: Diagnosis not present

## 2023-10-03 ENCOUNTER — Encounter: Payer: Self-pay | Admitting: Medical-Surgical

## 2023-10-03 ENCOUNTER — Ambulatory Visit (INDEPENDENT_AMBULATORY_CARE_PROVIDER_SITE_OTHER): Payer: 59 | Admitting: Medical-Surgical

## 2023-10-03 VITALS — BP 117/74 | HR 85 | Resp 20 | Ht 62.0 in | Wt 228.1 lb

## 2023-10-03 DIAGNOSIS — I150 Renovascular hypertension: Secondary | ICD-10-CM

## 2023-10-03 DIAGNOSIS — E1165 Type 2 diabetes mellitus with hyperglycemia: Secondary | ICD-10-CM

## 2023-10-03 LAB — POCT GLYCOSYLATED HEMOGLOBIN (HGB A1C)
HbA1c, POC (controlled diabetic range): 6.3 % (ref 0.0–7.0)
Hemoglobin A1C: 6.3 % — AB (ref 4.0–5.6)

## 2023-10-03 MED ORDER — SEMAGLUTIDE (1 MG/DOSE) 4 MG/3ML ~~LOC~~ SOPN: 1.0000 mg | PEN_INJECTOR | SUBCUTANEOUS | 1 refills | Status: DC

## 2023-10-03 NOTE — Progress Notes (Signed)
        Established patient visit  History, exam, impression, and plan:  1. Type 2 diabetes mellitus with hyperglycemia, without long-term current use of insulin (HCC) (Primary) Very pleasant 38 year old female presenting today for follow-up on type 2 diabetes.  Her last A1c 3 months ago was 9.7% and she was started on Ozempic.  She has been taking Ozempic 0.5 mg weekly as well as Toujeo 10 units nightly.  She is not using fast acting mealtime insulin.  Using the freestyle libre to monitor her glucose and reports that lately she has been having readings between 60s and 90.  She is asymptomatic with sugars in the 60s.  Recheck of her hemoglobin A1c today was 6.3% showing excellent control.  Since she is seeking weight loss benefits from Ozempic, plan to increase Ozempic to 1 mg weekly and reduce Toujeo to 6 units daily.  Titrate every 3 days down by 2 units until fasting sugars are 90-120.  - POCT HgB A1C  2. Renovascular hypertension She is currently being treated with amlodipine 10 mg daily and carvedilol 3.125 mg twice daily.  Tolerating both medications well without side effects.  Frequent doctors appointments and notes that her blood pressure gets elevated when she has to go to [facility].  Today her blood pressure was at goal however we rechecked it and it was beautiful at 117/74.  No concerning symptoms today.  Cardiopulmonary exam normal.  Continue amlodipine and carvedilol as prescribed.   Procedures performed this visit: None.  Return in about 3 months (around 01/01/2024) for DM/HTN/HLD follow up.  __________________________________ Thayer Ohm, DNP, APRN, FNP-BC Primary Care and Sports Medicine [facility] [place]

## 2023-10-15 DIAGNOSIS — Z4822 Encounter for aftercare following kidney transplant: Secondary | ICD-10-CM | POA: Diagnosis not present

## 2023-10-15 DIAGNOSIS — Z5181 Encounter for therapeutic drug level monitoring: Secondary | ICD-10-CM | POA: Diagnosis not present

## 2023-10-15 DIAGNOSIS — Z940 Kidney transplant status: Secondary | ICD-10-CM | POA: Diagnosis not present

## 2023-10-15 DIAGNOSIS — Z79621 Long term (current) use of calcineurin inhibitor: Secondary | ICD-10-CM | POA: Diagnosis not present

## 2023-10-15 DIAGNOSIS — Z79899 Other long term (current) drug therapy: Secondary | ICD-10-CM | POA: Diagnosis not present

## 2023-10-15 DIAGNOSIS — Z7969 Long term (current) use of other immunomodulators and immunosuppressants: Secondary | ICD-10-CM | POA: Diagnosis not present

## 2023-10-15 DIAGNOSIS — Z794 Long term (current) use of insulin: Secondary | ICD-10-CM | POA: Diagnosis not present

## 2023-10-15 DIAGNOSIS — I10 Essential (primary) hypertension: Secondary | ICD-10-CM | POA: Diagnosis not present

## 2023-10-15 DIAGNOSIS — E1169 Type 2 diabetes mellitus with other specified complication: Secondary | ICD-10-CM | POA: Diagnosis not present

## 2023-11-12 DIAGNOSIS — Z940 Kidney transplant status: Secondary | ICD-10-CM | POA: Diagnosis not present

## 2023-12-10 DIAGNOSIS — Z940 Kidney transplant status: Secondary | ICD-10-CM | POA: Diagnosis not present

## 2023-12-25 ENCOUNTER — Other Ambulatory Visit: Payer: 59

## 2023-12-25 NOTE — Progress Notes (Signed)
   12/25/2023  Patient ID: Lindsay Hall, female   DOB: 29-Sep-1985, 38 y.o.   MRN: [id number]  Subjective/Objective Telephone visit to follow-up on management of diabetes  Diabetes Management Plan -Current medications:  Ozempic  1mg  weekly, Toujeo  Max 300 unit/mL 6 units daily -Patient has not needed Humalog  recently based on BG readings -Using Libre 3 for CGM and endorses FBG sometimes in the 60's with no s/sx of hypoglycemia.  BG can elevate to 150 at times. -Ozempic  increased to 1mg  approximately 3 months ago, and patient is tolerating well with no adverse side effects -A1c 1/29 was 6.3%, down from 9.7%- congratulated patient on this -Endorses some additional weight loss around 10-12lbs since increasing Ozempic  to 1mg  weekly (current weight 215-217)  Assessment/Plan  Diabetes Management Plan -Controlled -Sees PCP again 4/29 and will be due for A1c -I recommend consideration of increasing Ozempic  to 2mg  weekly and stopping insulin  - patient in agreement if PCP open to recommendation  Follow-up:  5/28  Linn Rich, PharmD, DPLA

## 2024-01-01 ENCOUNTER — Ambulatory Visit (INDEPENDENT_AMBULATORY_CARE_PROVIDER_SITE_OTHER): Payer: 59 | Admitting: Medical-Surgical

## 2024-01-01 ENCOUNTER — Encounter: Payer: Self-pay | Admitting: Medical-Surgical

## 2024-01-01 ENCOUNTER — Ambulatory Visit: Payer: Self-pay

## 2024-01-01 VITALS — BP 125/82 | HR 83 | Resp 20 | Ht 62.0 in | Wt 217.7 lb

## 2024-01-01 DIAGNOSIS — E1169 Type 2 diabetes mellitus with other specified complication: Secondary | ICD-10-CM

## 2024-01-01 DIAGNOSIS — E785 Hyperlipidemia, unspecified: Secondary | ICD-10-CM | POA: Diagnosis not present

## 2024-01-01 DIAGNOSIS — I150 Renovascular hypertension: Secondary | ICD-10-CM | POA: Diagnosis not present

## 2024-01-01 DIAGNOSIS — E1165 Type 2 diabetes mellitus with hyperglycemia: Secondary | ICD-10-CM | POA: Diagnosis not present

## 2024-01-01 LAB — POCT GLYCOSYLATED HEMOGLOBIN (HGB A1C)
HbA1c, POC (controlled diabetic range): 5.7 % (ref 0.0–7.0)
Hemoglobin A1C: 5.7 % — AB (ref 4.0–5.6)

## 2024-01-01 MED ORDER — SEMAGLUTIDE (2 MG/DOSE) 8 MG/3ML ~~LOC~~ SOPN: 2.0000 mg | PEN_INJECTOR | SUBCUTANEOUS | 1 refills | Status: DC

## 2024-01-01 NOTE — Progress Notes (Signed)
        Established patient visit  History, exam, impression, and plan:  1. Type 2 diabetes mellitus with hyperglycemia, without long-term current use of insulin  (HCC) (Primary) Very pleasant 38 year old female presenting today with a history of type 2 diabetes with hyperglycemia.  She has been checking sugars regularly and notes that her readings have been around 90 when checked fasting.  She is taking Ozempic  1 mg weekly and Toujeo  60 units daily.  No longer using fast acting insulin .  Her prior hemoglobin A1c was down to 6.3 from 9.7%.  Recheck today showing further improvement to 5.7%. The ultimate goal would be to get her off of the long acting insulin . Discussed increasing Ozempic  and she is agreeable. Increasing Ozempic  to 2 mg weekly.  Discontinue Toujeo .  Continue checking sugars with a fasting goal of 90-120.  2-hour postprandial sugars should be less than 180.  Plan to recheck A1c in 3 months. - POCT HgB A1C - Semaglutide , 2 MG/DOSE, 8 MG/3ML SOPN; Inject 2 mg as directed once a week.  Dispense: 3 mL; Refill: 1  2. Renovascular hypertension Currently taking amlodipine  10 mg daily with carvedilol  3.125 mg twice daily.  Tolerating both medications well without side effects.  Denies concerning symptoms.  Cardiopulmonary exam is normal.  Blood pressure is at goal today at 125/82.  Continue amlodipine  and carvedilol  as prescribed.  3. Hyperlipidemia associated with type 2 diabetes mellitus (HCC) Currently taking atorvastatin 10 mg daily, tolerating well without side effects.  Aiming for a low-fat heart healthy diet.  Working on weight loss.  Up-to-date on lipid checks.  Continue atorvastatin as prescribed.  Procedures performed this visit: None.  Return in about 3 months (around 04/01/2024) for DM follow up.  __________________________________ Lindsay Snook, DNP, APRN, FNP-BC Primary Care and Sports Medicine [facility] [place]

## 2024-01-01 NOTE — Telephone Encounter (Signed)
 Lindsay Hall with [facility] pharmacy called about the Ozempic  prescription sent in today. She had worked up to the 1mg  dose previously, but then had filled a lower dose in March. She has 1mg  refills remaining, so they are unsure if patient is needing to do the 1mg  refills first before going up to 2mg  or if the lower dose in March was accidental. She is reaching ou tot the patient for additional information as well. Please advise.

## 2024-01-02 MED ORDER — SEMAGLUTIDE (2 MG/DOSE) 8 MG/3ML ~~LOC~~ SOPN: 2.0000 mg | PEN_INJECTOR | SUBCUTANEOUS | 1 refills | Status: DC

## 2024-01-02 NOTE — Addendum Note (Signed)
 Addended by: Oseas Detty Z on: 01/02/2024 04:21 PM   Modules accepted: Orders

## 2024-01-10 DIAGNOSIS — Z940 Kidney transplant status: Secondary | ICD-10-CM | POA: Diagnosis not present

## 2024-01-30 ENCOUNTER — Other Ambulatory Visit: Payer: Self-pay

## 2024-01-30 NOTE — Progress Notes (Signed)
   01/30/2024  Patient ID: Lindsay Hall, female   DOB: 11/09/1985, 38 y.o.   MRN: [id number]  Subjective/Objective Telephone visit to follow-up on management of diabetes   Diabetes Management Plan -Current medications:  Ozempic  1mg  weekly, Toujeo  Max 300 unit/mL 6 units daily -Patient has not needed Humalog  recently based on BG readings -Using [facility] 3 for CGM and endorses rare nocturnal hypoglycemia with BG in the 50's or 60's- Libre will alert and snack brings BG back in normal range -FBG and post-prandial well controlled -A1c 5.7% on 4/29, down from 6.3% -Has not yet increased Ozempic  to 2mg  and stopped insulin , because she does not want to waste 1mg  pens on hand- currently has 2 boxes remaining   Assessment/Plan   Diabetes Management Plan -Controlled -Patient will start using 2 injections of Ozempic  1mg  weekly to equal 2mg  weekly until 1mg  pens at home are gone -Prescription for Ozempic  2mg  weekly has already been sent to her pharmacy -Stop Toujeo  -Email sent to link Libre CGM data to [employer] dashboard   Follow-up:  4 weeks   Linn Rich, PharmD, DPLA

## 2024-02-12 DIAGNOSIS — Z940 Kidney transplant status: Secondary | ICD-10-CM | POA: Diagnosis not present

## 2024-02-27 ENCOUNTER — Other Ambulatory Visit: Payer: Self-pay

## 2024-02-27 NOTE — Progress Notes (Unsigned)
   02/27/2024  Patient ID: Lindsay Hall, female   DOB: 1986-01-15, 38 y.o.   MRN: [id number]  Outreach attempt for scheduled telephone follow-up visit unsuccessful, but I was able to leave HIPAA compliant voicemail with my direct phone number.  I am also sending a MyChart message to attempt to reschedule visit; if I do not hear back, I will try to call the patient again in 1 week.  Lindsay Hall, PharmD, DPLA

## 2024-03-03 ENCOUNTER — Other Ambulatory Visit: Payer: Self-pay

## 2024-03-03 MED ORDER — AMLODIPINE BESYLATE 10 MG PO TABS: 10.0000 mg | ORAL_TABLET | Freq: Every day | ORAL | 1 refills | Status: AC

## 2024-03-03 NOTE — Progress Notes (Signed)
   03/03/2024  Patient ID: Lindsay Hall, female   DOB: July 18, 1986, 38 y.o.   MRN: [id number]  Subjective/Objective Telephone visit to follow-up on management of diabetes   Diabetes Management Plan -Current medications:  Ozempic  2mg  weekly -Patient recently increase to Ozempic  2mg  weekly and endorses tolerating well with no adverse side effects  Toujeo  was stopped when Ozempic  dose was increased. -Using Libre 3 for CGM and endorses rare nocturnal hypoglycemia with BG in the 50's or 60's- suspicious if this may be caused by sleeping on side where [name] is placed -FBG and post-prandial well controlled -A1c 5.7% on 4/29, down from 6.3%  Hypertension -Current medications:  amlodipine  10mg  daily, carvedilol  3.125mg  BID -Patient is needing a new prescription sent to pharmacy for amlodipine  10mg  -Last OV BP 125/82   Assessment/Plan   Diabetes Management Plan -Controlled -Continue Ozempic  2mg  weekly -Continue Libre for CGM-advised patient to check BG with fingerstick next time [place] alerts that BG 50-60 during the night to verify BG actually low versus pressure to sensor causing false low readings. -Sees PCP again 7/29 and will be due for A1c  Hypertension -Moderately controlled  -Continue current regimen at this time -Prescription for amlodipine  10mg  daily pending for PCP to sign if in agreement   Follow-up:  3 months   Channing DELENA Mealing, PharmD, DPLA

## 2024-03-10 DIAGNOSIS — Z940 Kidney transplant status: Secondary | ICD-10-CM | POA: Diagnosis not present

## 2024-03-13 DIAGNOSIS — Z940 Kidney transplant status: Secondary | ICD-10-CM | POA: Diagnosis not present

## 2024-03-14 ENCOUNTER — Other Ambulatory Visit: Payer: Self-pay | Admitting: Medical-Surgical

## 2024-04-01 ENCOUNTER — Ambulatory Visit (INDEPENDENT_AMBULATORY_CARE_PROVIDER_SITE_OTHER): Admitting: Medical-Surgical

## 2024-04-01 ENCOUNTER — Encounter: Payer: Self-pay | Admitting: Medical-Surgical

## 2024-04-01 VITALS — BP 115/75 | HR 80 | Resp 20 | Ht 62.0 in | Wt 212.0 lb

## 2024-04-01 DIAGNOSIS — I10 Essential (primary) hypertension: Secondary | ICD-10-CM | POA: Diagnosis not present

## 2024-04-01 DIAGNOSIS — Z7985 Long-term (current) use of injectable non-insulin antidiabetic drugs: Secondary | ICD-10-CM

## 2024-04-01 DIAGNOSIS — R454 Irritability and anger: Secondary | ICD-10-CM | POA: Insufficient documentation

## 2024-04-01 DIAGNOSIS — E1165 Type 2 diabetes mellitus with hyperglycemia: Secondary | ICD-10-CM

## 2024-04-01 LAB — POCT GLYCOSYLATED HEMOGLOBIN (HGB A1C)
HbA1c, POC (controlled diabetic range): 5.8 % (ref 0.0–7.0)
Hemoglobin A1C: 5.8 % — AB (ref 4.0–5.6)

## 2024-04-01 MED ORDER — FREESTYLE LIBRE 3 PLUS SENSOR MISC
4 refills | Status: AC
Start: 2024-04-01 — End: ?

## 2024-04-01 MED ORDER — BUPROPION HCL ER (XL) 150 MG PO TB24: 150.0000 mg | ORAL_TABLET | Freq: Every day | ORAL | 0 refills | Status: DC

## 2024-04-01 MED ORDER — SEMAGLUTIDE (2 MG/DOSE) 8 MG/3ML ~~LOC~~ SOPN: 2.0000 mg | PEN_INJECTOR | SUBCUTANEOUS | 1 refills | Status: AC

## 2024-04-01 NOTE — Progress Notes (Signed)
        Established patient visit   History of Present Illness   Discussed the use of AI scribe software for clinical note transcription with the patient, who gave verbal consent to proceed.  History of Present Illness   Lindsay Hall is a 38 year old female with diabetes who presents for a diabetes follow-up.  Her hemoglobin A1c has improved from 9.7% to 5.8% over the past nine months. She has discontinued Toujeo  and increased her Ozempic  dosage, experiencing transient nausea after the last dose. Blood glucose levels are well-controlled, with morning readings in the seventies and eighties and postprandial levels below 130.    She has a history of renovascular hypertension currently well managed with Amlodipine  10mg  daily and carvedilol  3.125mg  twice daily. Blood pressure closely monitored and has been stable.    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 Has difficulty tolerating being around other people and wants to be left alone. Easily annoyed by others and often feels that they are trying to force her to feel differently and do things that she doesn't want to do. Not interested in counseling at this point. Open to options that may help.      Physical Exam   Physical Exam Vitals reviewed.  Constitutional:      General: She is not in acute distress.    Appearance: Normal appearance.  HENT:     Head: Normocephalic and atraumatic.  Cardiovascular:     Rate and Rhythm: Normal rate and regular rhythm.     Pulses: Normal pulses.     Heart sounds: Normal heart sounds. No murmur heard.    No friction rub. No gallop.  Pulmonary:     Effort: Pulmonary effort is normal. No respiratory distress.     Breath sounds: Normal breath sounds. No wheezing.  Skin:    General: Skin is warm and dry.  Neurological:     Mental Status: She is alert and oriented to person, place, and time.  Psychiatric:        Mood and Affect: Mood normal.         Behavior: Behavior normal.        Thought Content: Thought content normal.        Judgment: Judgment normal.     Assessment & Plan   Assessment and Plan    Type 2 diabetes mellitus Significant improvement in glycemic control with hemoglobin A1c reduced to 5.8%. Fasting glucose in 70s-80s, postprandial <130 mg/dL. Transient nausea with Ozempic . - Continue Ozempic . - Maintain tight glycemic control.   Renovascular hypertension Stable and well controlled on Amlodipine  and carvedilol . Closely monitored with frequent medical appointments.  - Continue Amlodipine . - Continue carvedilol .    Irritability Worsening irritability and poor tolerance of social interactions. Discussed options to treat but not open to counseling.  - Start Wellbutrin  XL 150mg  daily. - Monitor for worsening symptoms. - Follow up in 4 weeks.     Follow up   Return in about 4 weeks (around 04/29/2024) for mood follow up.  __________________________________ Zada FREDRIK Palin, DNP, APRN, FNP-BC Primary Care and Sports Medicine [facility]

## 2024-04-08 DIAGNOSIS — Z940 Kidney transplant status: Secondary | ICD-10-CM | POA: Diagnosis not present

## 2024-04-29 ENCOUNTER — Ambulatory Visit: Admitting: Medical-Surgical

## 2024-05-07 DIAGNOSIS — Z940 Kidney transplant status: Secondary | ICD-10-CM | POA: Diagnosis not present

## 2024-05-09 ENCOUNTER — Ambulatory Visit: Admitting: Medical-Surgical

## 2024-05-09 ENCOUNTER — Encounter: Payer: Self-pay | Admitting: Medical-Surgical

## 2024-05-09 VITALS — BP 114/74 | HR 81 | Resp 20 | Ht 62.0 in | Wt 206.0 lb

## 2024-05-09 DIAGNOSIS — Z23 Encounter for immunization: Secondary | ICD-10-CM | POA: Diagnosis not present

## 2024-05-09 DIAGNOSIS — F418 Other specified anxiety disorders: Secondary | ICD-10-CM | POA: Diagnosis not present

## 2024-05-09 MED ORDER — BUPROPION HCL ER (XL) 150 MG PO TB24
150.0000 mg | ORAL_TABLET | Freq: Every day | ORAL | 3 refills | Status: AC
Start: 2024-05-09 — End: ?

## 2024-05-09 NOTE — Progress Notes (Signed)
        Established patient visit   History of Present Illness   Discussed the use of AI scribe software for clinical note transcription with the patient, who gave verbal consent to proceed.  History of Present Illness   Lindsay Hall is a 38 year old female who presents for a follow-up on Wellbutrin  and vitamin D levels.  Mood and affect - Stable mood with significant improvement since starting Wellbutrin  - No anxiety, irritability, or thoughts of self-harm or harm to others - No side effects from Wellbutrin  - Taking Wellbutrin  once daily in the morning  Occupational status - Currently suspended from job due to suspicion of marijuana use - Taking a break from healthcare work      Physical Exam   Physical Exam Vitals reviewed.  Constitutional:      General: She is not in acute distress.    Appearance: Normal appearance.  HENT:     Head: Normocephalic and atraumatic.  Cardiovascular:     Rate and Rhythm: Normal rate and regular rhythm.     Pulses: Normal pulses.     Heart sounds: Normal heart sounds. No murmur heard.    No friction rub. No gallop.  Pulmonary:     Effort: Pulmonary effort is normal. No respiratory distress.     Breath sounds: Normal breath sounds. No wheezing.  Skin:    General: Skin is warm and dry.  Neurological:     Mental Status: She is alert and oriented to person, place, and time.  Psychiatric:        Mood and Affect: Mood normal.        Behavior: Behavior normal.        Thought Content: Thought content normal.        Judgment: Judgment normal.    Assessment & Plan   Assessment and Plan    Major depressive disorder, single episode, unspecified Significant mood improvement with Wellbutrin . No side effects or harmful thoughts. - Continue Wellbutrin  150mg  daily.   Need for influenza vaccine - Flu vaccine given in office today.     Follow up   Return in about 5 months (around 10/09/2024) for chronic disease follow  up. __________________________________ Zada FREDRIK Palin, DNP, APRN, FNP-BC Primary Care and Sports Medicine [facility] [place]

## 2024-05-24 DIAGNOSIS — M62830 Muscle spasm of back: Secondary | ICD-10-CM | POA: Diagnosis not present

## 2024-06-03 ENCOUNTER — Other Ambulatory Visit: Payer: Self-pay

## 2024-06-03 DIAGNOSIS — E1169 Type 2 diabetes mellitus with other specified complication: Secondary | ICD-10-CM

## 2024-06-03 DIAGNOSIS — E1165 Type 2 diabetes mellitus with hyperglycemia: Secondary | ICD-10-CM

## 2024-06-03 DIAGNOSIS — I10 Essential (primary) hypertension: Secondary | ICD-10-CM

## 2024-06-03 NOTE — Progress Notes (Signed)
   06/03/2024  Patient ID: Lindsay Hall, female   DOB: 1986/06/06, 38 y.o.   MRN: [id number]  Subjective/Objective Telephone visit to follow-up on management of diabetes   Diabetes Management Plan -Current medications:  Ozempic  2mg  weekly -Using Libre 3 for CGM and endorses FBG 70-130 with occasional post-prandial spikes but still within normal limits -A1c 5.8% on 7/29 -Does not endorse any s/sx of hypoglycemia or hyperglycemia -No ACEi/ARB on board, but patient is followed by nephrology/transplant; and BP is well controlled on current regimen -Statin on board for ASCVD risk reduction:  atorvastatin 10mg    Hypertension -Current medications:  amlodipine  10mg  daily, carvedilol  3.125mg  BID -Last OV BP 114/74 on 9/5 -Patient does monitor home BP and states home readings are controlled at <130/80   Assessment/Plan   Diabetes Management Plan -Controlled -Continue Ozempic  2mg  weekly -Continue Libre for CGM -Sees PCP again in February and will be due for A1c; I also recommend a follow-up lipid panel and CMP that this time; if LDL >70, consider increasing atorvastatin to 20mg  daily   Hypertension -Controlled -Continue current regimen at this time -Continue to monitor home BP regularly   Follow-up:  3 months   Channing DELENA Mealing, PharmD, DPLA

## 2024-06-04 DIAGNOSIS — Z940 Kidney transplant status: Secondary | ICD-10-CM | POA: Diagnosis not present

## 2024-06-06 DIAGNOSIS — R9389 Abnormal findings on diagnostic imaging of other specified body structures: Secondary | ICD-10-CM | POA: Diagnosis not present

## 2024-06-06 DIAGNOSIS — J9811 Atelectasis: Secondary | ICD-10-CM | POA: Diagnosis not present

## 2024-06-09 ENCOUNTER — Ambulatory Visit: Admitting: Obstetrics & Gynecology

## 2024-06-09 ENCOUNTER — Encounter: Payer: Self-pay | Admitting: Obstetrics & Gynecology

## 2024-06-09 VITALS — BP 132/84 | HR 89 | Ht 62.0 in | Wt 200.1 lb

## 2024-06-09 DIAGNOSIS — Z01419 Encounter for gynecological examination (general) (routine) without abnormal findings: Secondary | ICD-10-CM | POA: Diagnosis not present

## 2024-06-09 NOTE — Progress Notes (Signed)
 Last pap smear (date and result):03/27/22 Last mammogram (date and result):N/A Last colon screening (date and result):N/A Brush:yes Floss:yes Seatbelts: yes Sunscreen: yes   . Subjective:     Lindsay Hall is a 38 y.o. female here for a routine exam.  Current complaints: enjoys Nexplanon --rare menses.   Gynecologic History Patient's last menstrual period was 05/06/2024 (approximate). Contraception: Nexplanon  Last pap smear (date and result):03/27/22 Last mammogram (date and result):N/A Last colon screening (date and result):N/A Brush:yes Floss:yes Seatbelts: yes Sunscreen: yes   Obstetric History OB History  Gravida Para Term Preterm AB Living  4 3 3  1    SAB IAB Ectopic Multiple Live Births  1    3    # Outcome Date GA Lbr Len/2nd Weight Sex Type Anes PTL Lv  4 SAB           3 Term           2 Term           1 Term              The following portions of the patient's history were reviewed and updated as appropriate: allergies, current medications, past family history, past medical history, past social history, past surgical history, and problem list.  Review of Systems Pertinent items noted in HPI and remainder of comprehensive ROS otherwise negative.    Objective:     Vitals:   06/09/24 1103  BP: 132/84  Pulse: 89  Weight: 200 lb 1.3 oz (90.8 kg)  Height: 5' 2 (1.575 m)   Vitals:  WNL General appearance: alert, cooperative and no distress  HEENT: Normocephalic, without obvious abnormality, atraumatic Eyes: negative Throat: lips, mucosa, and tongue normal; teeth and gums normal  Respiratory: Clear to auscultation bilaterally  CV: Regular rate and rhythm  Breasts:  Normal appearance, no masses or tenderness, no nipple retraction or dimpling  GI: Soft, non-tender; bowel sounds normal; no masses,  no organomegaly  GU: External Genitalia:  Tanner V, no lesion Urethra:  No prolapse   Vagina: Pink, normal rugae, discharge; small amount of blood in vault.    Cervix: No CMT, no lesion  Uterus:  Normal size and contour, non tender  Adnexa: Normal, no masses, non tender  Musculoskeletal: No edema, redness or tenderness in the calves or thighs  Skin: No lesions or rash  Lymphatic: Axillary adenopathy: none     Psychiatric: Normal mood and behavior        Assessment:    Healthy female exam.    Plan:    Pap up to date Mammogram for December at 38th b day Continue Nexplanon --placed in 2024 RTC 1 year.

## 2024-06-16 DIAGNOSIS — Z940 Kidney transplant status: Secondary | ICD-10-CM | POA: Diagnosis not present

## 2024-06-29 ENCOUNTER — Ambulatory Visit (HOSPITAL_BASED_OUTPATIENT_CLINIC_OR_DEPARTMENT_OTHER)
Admission: RE | Admit: 2024-06-29 | Discharge: 2024-06-29 | Disposition: A | Source: Ambulatory Visit | Attending: Obstetrics & Gynecology | Admitting: Obstetrics & Gynecology

## 2024-06-29 DIAGNOSIS — Z1231 Encounter for screening mammogram for malignant neoplasm of breast: Secondary | ICD-10-CM | POA: Diagnosis not present

## 2024-06-29 DIAGNOSIS — Z01419 Encounter for gynecological examination (general) (routine) without abnormal findings: Secondary | ICD-10-CM

## 2024-06-30 DIAGNOSIS — Z940 Kidney transplant status: Secondary | ICD-10-CM | POA: Diagnosis not present

## 2024-06-30 DIAGNOSIS — Z5181 Encounter for therapeutic drug level monitoring: Secondary | ICD-10-CM | POA: Diagnosis not present

## 2024-06-30 DIAGNOSIS — Z794 Long term (current) use of insulin: Secondary | ICD-10-CM | POA: Diagnosis not present

## 2024-06-30 DIAGNOSIS — Z4822 Encounter for aftercare following kidney transplant: Secondary | ICD-10-CM | POA: Diagnosis not present

## 2024-06-30 DIAGNOSIS — D84821 Immunodeficiency due to drugs: Secondary | ICD-10-CM | POA: Diagnosis not present

## 2024-06-30 DIAGNOSIS — I10 Essential (primary) hypertension: Secondary | ICD-10-CM | POA: Diagnosis not present

## 2024-06-30 DIAGNOSIS — Z79899 Other long term (current) drug therapy: Secondary | ICD-10-CM | POA: Diagnosis not present

## 2024-06-30 DIAGNOSIS — Z79621 Long term (current) use of calcineurin inhibitor: Secondary | ICD-10-CM | POA: Diagnosis not present

## 2024-06-30 DIAGNOSIS — Z7969 Long term (current) use of other immunomodulators and immunosuppressants: Secondary | ICD-10-CM | POA: Diagnosis not present

## 2024-06-30 DIAGNOSIS — D849 Immunodeficiency, unspecified: Secondary | ICD-10-CM | POA: Diagnosis not present

## 2024-06-30 DIAGNOSIS — Z0000 Encounter for general adult medical examination without abnormal findings: Secondary | ICD-10-CM | POA: Diagnosis not present

## 2024-06-30 DIAGNOSIS — E139 Other specified diabetes mellitus without complications: Secondary | ICD-10-CM | POA: Diagnosis not present

## 2024-06-30 DIAGNOSIS — E1169 Type 2 diabetes mellitus with other specified complication: Secondary | ICD-10-CM | POA: Diagnosis not present

## 2024-06-30 DIAGNOSIS — N02B9 Other recurrent and persistent immunoglobulin A nephropathy: Secondary | ICD-10-CM | POA: Diagnosis not present

## 2024-07-06 ENCOUNTER — Ambulatory Visit: Payer: Self-pay | Admitting: Obstetrics & Gynecology

## 2024-07-22 ENCOUNTER — Other Ambulatory Visit: Payer: Self-pay | Admitting: Medical-Surgical

## 2024-07-22 DIAGNOSIS — I150 Renovascular hypertension: Secondary | ICD-10-CM

## 2024-08-08 ENCOUNTER — Telehealth: Payer: Self-pay

## 2024-08-08 NOTE — Telephone Encounter (Signed)
 Patient is aware voiced her understanding. Also sent MyChart with contact info

## 2024-08-08 NOTE — Telephone Encounter (Signed)
-----   Message from Zada Palin sent at 08/07/2024  4:01 PM EST ----- Please contact patient to let her know that there has been a recall on her continuous glucose sensors. She should contact her pharmacy or call [employer] at [phone] for further instructions. ----- Message ----- From: Vicci Gladystine LABOR, NT Sent: 08/06/2024   3:13 PM EST To: Zada Palin, NP

## 2024-08-18 NOTE — Progress Notes (Unsigned)
° °  08/19/24  Patient ID: Lindsay Hall, female   DOB: Aug 29, 1986, 38 y.o.   MRN: [id number]  Subjective/Objective Telephone visit to follow-up on management of diabetes and hypertension   Diabetes Management Plan -Current medications:  Ozempic  2mg  weekly -Using Libre 3 for CGM and endorses FBG readings usually in the 80's to 90's with an occasional reading of 130 -Does endorse occasional readings in the 60's but states she does not have s/sx of hypoglycemia; and BG will increase to normal range on it's own  -No ACEi/ARB on board, but patient is followed by nephrology/transplant; and BP is controlled on current regimen -UACR 30-300 07/03/2023 -Statin on board for ASCVD risk reduction:  atorvastatin 10mg   -A1c was done on 10/17, and this was 5.7%  Hypertension -Current medications:  amlodipine  10mg  daily, carvedilol  3.125mg  BID -Last OV BP 132/84 on 10/4 at [facility] -Patient does monitor home BP and states home readings are controlled at <130/80  Lab Results  Component Value Date   HGBA1C 5.8 (A) 04/01/2024   HGBA1C 5.8 04/01/2024   HGBA1C 5.7 (A) 01/01/2024   HGBA1C 5.7 01/01/2024      Component Value Date/Time   NA 140 04/29/2021 0000   K 4.9 04/29/2021 0000   CL 107 04/29/2021 0000   CO2 22 04/29/2021 0000   GLUCOSE 72 04/29/2021 0000   BUN 19 04/29/2021 0000   CREATININE 2.64 (H) 04/29/2021 0000   CALCIUM 9.1 04/29/2021 0000   PROT 6.6 11/10/2020 0000   ALBUMIN  1.3 (L) 07/30/2020 0049   AST 9 (L) 11/10/2020 0000   ALT 9 11/10/2020 0000   ALKPHOS 67 07/30/2020 0049   BILITOT 0.2 11/10/2020 0000   EGFR 44 09/17/2023 0000   GFRNONAA 37 (L) 11/10/2020 0000      Component Value Date/Time   CHOL 218 (H) 03/27/2022 0000   TRIG 126 03/27/2022 0000   HDL 40 (L) 03/27/2022 0000   CHOLHDL 5.5 (H) 03/27/2022 0000   LDLCALC 153 (H) 03/27/2022 0000      Assessment/Plan   Diabetes Management Plan -Controlled with A1c <7% -UACR not at goal of <30 -LDL is not at goal of  <70 -Continue Ozempic  2mg  weekly -Continue Libre for CGM -Sees PCP again in February and will be due for A1c, CMP, UACR, and lipid panel (if not done at nephrology follow-up on 1/13).  If LDL >70, consider increasing atorvastatin to 20mg  daily  -Could benefit from addition of SGLT2, but I would default to nephrology/transplant to manage  Hypertension -Controlled -Continue current regimen at this time -Continue to monitor home BP regularly   Follow-up:  6 months   Lindsay Hall, PharmD, DPLA

## 2024-08-19 ENCOUNTER — Other Ambulatory Visit: Payer: Self-pay | Admitting: Medical-Surgical

## 2024-08-19 ENCOUNTER — Other Ambulatory Visit: Payer: Self-pay

## 2024-08-19 DIAGNOSIS — I10 Essential (primary) hypertension: Secondary | ICD-10-CM

## 2024-08-19 DIAGNOSIS — E1165 Type 2 diabetes mellitus with hyperglycemia: Secondary | ICD-10-CM

## 2024-08-19 DIAGNOSIS — I150 Renovascular hypertension: Secondary | ICD-10-CM

## 2024-08-19 DIAGNOSIS — E1169 Type 2 diabetes mellitus with other specified complication: Secondary | ICD-10-CM

## 2024-09-17 ENCOUNTER — Other Ambulatory Visit: Payer: Self-pay | Admitting: Medical-Surgical

## 2024-09-17 DIAGNOSIS — I150 Renovascular hypertension: Secondary | ICD-10-CM

## 2024-09-22 NOTE — Progress Notes (Signed)
 Nomi Rudnicki                                          MRN: [id number]   09/22/2024   The [facility] Quality Team Specialist reviewed this patient medical record for the purposes of chart review for care gap closure. The following were reviewed: abstraction for care gap closure-glycemic status assessment.    [facility] Quality Team

## 2024-10-09 ENCOUNTER — Ambulatory Visit: Admitting: Medical-Surgical

## 2024-10-09 ENCOUNTER — Encounter: Payer: Self-pay | Admitting: Medical-Surgical

## 2024-10-09 VITALS — BP 118/78 | HR 85 | Resp 20 | Ht 62.0 in | Wt 196.2 lb

## 2024-10-09 DIAGNOSIS — F418 Other specified anxiety disorders: Secondary | ICD-10-CM

## 2024-10-09 DIAGNOSIS — I10 Essential (primary) hypertension: Secondary | ICD-10-CM

## 2024-10-09 DIAGNOSIS — E1165 Type 2 diabetes mellitus with hyperglycemia: Secondary | ICD-10-CM

## 2024-10-09 DIAGNOSIS — E1169 Type 2 diabetes mellitus with other specified complication: Secondary | ICD-10-CM

## 2024-10-09 LAB — POCT GLYCOSYLATED HEMOGLOBIN (HGB A1C)
HbA1c, POC (controlled diabetic range): 5.3 % (ref 0.0–7.0)
Hemoglobin A1C: 5.3 % (ref 4.0–5.6)

## 2024-10-09 LAB — POCT UA - MICROALBUMIN
Creatinine, POC: 300 mg/dL
Microalbumin Ur, POC: 150 mg/L

## 2024-10-09 NOTE — Progress Notes (Signed)
" ° °       Established patient visit   History of Present Illness   Discussed the use of AI scribe software for clinical note transcription with the patient, who gave verbal consent to proceed.  History of Present Illness   Lindsay Hall is a 39 year old female with diabetes and hypertension who presents for a follow-up visit.  Glycemic control and hypoglycemia - Diabetes with occasional nocturnal hypoglycemia, blood glucose dropping to 50 mg/dL - Morning blood glucose values typically in the 70s - Currently taking Ozempic  2 mg, which aids in weight loss - Desires to continue current Ozempic  dose as long as no symptoms of hypoglycemia  Medication burden - Feels overall medication burden is high - Expresses desire to reduce the number of medications  Hyperlipidemia - Currently taking Lipitor 10 mg daily  Mood disturbance - Takes Wellbutrin  for mood, effective and well-tolerated  Insomnia and daytime fatigue - Difficulty falling asleep despite melatonin use - Typically awake until 3-4 AM, wakes at about 7 AM - Daytime fatigue persists     Physical Exam   Physical Exam Vitals reviewed.  Constitutional:      General: She is not in acute distress.    Appearance: Normal appearance. She is not ill-appearing.  HENT:     Head: Normocephalic and atraumatic.  Cardiovascular:     Rate and Rhythm: Normal rate and regular rhythm.     Pulses: Normal pulses.     Heart sounds: Normal heart sounds. No murmur heard.    No friction rub. No gallop.  Pulmonary:     Effort: Pulmonary effort is normal. No respiratory distress.     Breath sounds: Normal breath sounds. No wheezing.  Skin:    General: Skin is warm and dry.  Neurological:     Mental Status: She is alert and oriented to person, place, and time.  Psychiatric:        Mood and Affect: Mood normal.        Behavior: Behavior normal.        Thought Content: Thought content normal.        Judgment: Judgment normal.     Assessment & Plan     Type 2 diabetes mellitus with hyperglycemia  A1c improved to 5.3. Morning blood sugars in the 70s, nocturnal hypoglycemia around 50. Microalbumin remains abnormal. Ozempic  2 mg effective for weight loss and glycemic control. - Continue Ozempic  2 mg weekly. - Monitor blood glucose levels, especially nocturnal readings. - Monitor kidney function and microalbumin levels.  Primary hypertension Blood pressure well-controlled with amlodipine  and Coreg . - Continue amlodipine  10 mg daily and Coreg  3.125 mg twice daily.  Morbid obesity Ozempic  effective for weight loss. - Continue Ozempic .  Depression with anxiety Mood well-managed with Wellbutrin .  - Continue Wellbutrin  150 mg daily.  Insomnia Experiencing insomnia, using melatonin occasionally. - Continue melatonin as needed, ensure appropriate timing.     Follow up   Return in about 6 months (around 04/08/2025) for chronic disease follow up. __________________________________ Zada FREDRIK Palin, DNP, APRN, FNP-BC Primary Care and Sports Medicine [facility] [place] "

## 2025-02-17 ENCOUNTER — Other Ambulatory Visit

## 2025-04-08 ENCOUNTER — Ambulatory Visit: Admitting: Medical-Surgical
